# Patient Record
Sex: Female | Born: 1960 | Race: White | Hispanic: No | Marital: Married | Smoking: Never smoker
Health system: Southern US, Community
[De-identification: ages and names within clinical notes are randomized; demographics above are authoritative.]

## PROBLEM LIST (undated history)

## (undated) DIAGNOSIS — G4700 Insomnia, unspecified: Secondary | ICD-10-CM

## (undated) DIAGNOSIS — Z8489 Family history of other specified conditions: Secondary | ICD-10-CM

## (undated) DIAGNOSIS — F419 Anxiety disorder, unspecified: Secondary | ICD-10-CM

## (undated) DIAGNOSIS — I10 Essential (primary) hypertension: Secondary | ICD-10-CM

## (undated) DIAGNOSIS — M1990 Unspecified osteoarthritis, unspecified site: Secondary | ICD-10-CM

## (undated) DIAGNOSIS — R112 Nausea with vomiting, unspecified: Secondary | ICD-10-CM

## (undated) DIAGNOSIS — Z9889 Other specified postprocedural states: Secondary | ICD-10-CM

## (undated) DIAGNOSIS — F32A Depression, unspecified: Secondary | ICD-10-CM

## (undated) DIAGNOSIS — K219 Gastro-esophageal reflux disease without esophagitis: Secondary | ICD-10-CM

## (undated) DIAGNOSIS — F329 Major depressive disorder, single episode, unspecified: Secondary | ICD-10-CM

## (undated) HISTORY — DX: Essential (primary) hypertension: I10

## (undated) HISTORY — DX: Anxiety disorder, unspecified: F41.9

## (undated) HISTORY — PX: BREAST SURGERY: SHX581

## (undated) HISTORY — PX: OTHER SURGICAL HISTORY: SHX169

## (undated) HISTORY — DX: Depression, unspecified: F32.A

## (undated) HISTORY — DX: Unspecified osteoarthritis, unspecified site: M19.90

## (undated) HISTORY — DX: Insomnia, unspecified: G47.00

## (undated) HISTORY — DX: Major depressive disorder, single episode, unspecified: F32.9

---

## 2002-01-30 ENCOUNTER — Ambulatory Visit (HOSPITAL_BASED_OUTPATIENT_CLINIC_OR_DEPARTMENT_OTHER): Admission: RE | Admit: 2002-01-30 | Discharge: 2002-01-30 | Payer: Self-pay | Admitting: Orthopedic Surgery

## 2002-01-30 ENCOUNTER — Encounter: Payer: Self-pay | Admitting: Orthopedic Surgery

## 2005-03-12 ENCOUNTER — Other Ambulatory Visit: Admission: RE | Admit: 2005-03-12 | Discharge: 2005-03-12 | Payer: Self-pay | Admitting: Obstetrics and Gynecology

## 2011-08-03 ENCOUNTER — Ambulatory Visit
Admission: RE | Admit: 2011-08-03 | Discharge: 2011-08-03 | Disposition: A | Discharge status: Home / Self Care | Payer: 59 | Source: Ambulatory Visit | Attending: Physician Assistant | Admitting: Physician Assistant

## 2011-08-03 ENCOUNTER — Other Ambulatory Visit: Payer: Self-pay | Admitting: Physician Assistant

## 2011-08-03 DIAGNOSIS — R0781 Pleurodynia: Secondary | ICD-10-CM

## 2014-04-17 ENCOUNTER — Encounter: Payer: Self-pay | Admitting: *Deleted

## 2014-10-01 ENCOUNTER — Other Ambulatory Visit: Payer: Self-pay | Admitting: Obstetrics and Gynecology

## 2014-10-01 DIAGNOSIS — R928 Other abnormal and inconclusive findings on diagnostic imaging of breast: Secondary | ICD-10-CM

## 2014-10-26 ENCOUNTER — Other Ambulatory Visit: Payer: Self-pay

## 2014-11-22 ENCOUNTER — Ambulatory Visit
Admission: RE | Admit: 2014-11-22 | Discharge: 2014-11-22 | Disposition: A | Discharge status: Home / Self Care | Payer: 59 | Source: Ambulatory Visit | Attending: Obstetrics and Gynecology | Admitting: Obstetrics and Gynecology

## 2014-11-22 DIAGNOSIS — R928 Other abnormal and inconclusive findings on diagnostic imaging of breast: Secondary | ICD-10-CM

## 2016-11-20 ENCOUNTER — Emergency Department (HOSPITAL_COMMUNITY)
Admission: EM | Admit: 2016-11-20 | Discharge: 2016-11-20 | Disposition: A | Discharge status: Home / Self Care | Payer: 59 | Attending: Physician Assistant | Admitting: Physician Assistant

## 2016-11-20 ENCOUNTER — Encounter (HOSPITAL_COMMUNITY): Payer: Self-pay | Admitting: Emergency Medicine

## 2016-11-20 DIAGNOSIS — R112 Nausea with vomiting, unspecified: Secondary | ICD-10-CM | POA: Diagnosis present

## 2016-11-20 DIAGNOSIS — I10 Essential (primary) hypertension: Secondary | ICD-10-CM | POA: Insufficient documentation

## 2016-11-20 DIAGNOSIS — K0889 Other specified disorders of teeth and supporting structures: Secondary | ICD-10-CM | POA: Insufficient documentation

## 2016-11-20 LAB — COMPREHENSIVE METABOLIC PANEL
ALBUMIN: 3.7 g/dL (ref 3.5–5.0)
ALT: 17 U/L (ref 14–54)
AST: 19 U/L (ref 15–41)
Alkaline Phosphatase: 67 U/L (ref 38–126)
Anion gap: 10 (ref 5–15)
BILIRUBIN TOTAL: 0.7 mg/dL (ref 0.3–1.2)
BUN: 14 mg/dL (ref 6–20)
CO2: 23 mmol/L (ref 22–32)
Calcium: 8.3 mg/dL — ABNORMAL LOW (ref 8.9–10.3)
Chloride: 103 mmol/L (ref 101–111)
Creatinine, Ser: 0.64 mg/dL (ref 0.44–1.00)
GFR calc Af Amer: >60 mL/min (ref >60)
GFR calc non Af Amer: >60 mL/min (ref >60)
GLUCOSE: 111 mg/dL — AB (ref 65–99)
POTASSIUM: 4.2 mmol/L (ref 3.5–5.1)
Sodium: 136 mmol/L (ref 135–145)
Total Protein: 6.3 g/dL — ABNORMAL LOW (ref 6.5–8.1)

## 2016-11-20 LAB — CBC
HEMATOCRIT: 37.4 % (ref 36.0–46.0)
Hemoglobin: 12.4 g/dL (ref 12.0–15.0)
MCH: 30.5 pg (ref 26.0–34.0)
MCHC: 33.2 g/dL (ref 30.0–36.0)
MCV: 91.9 fL (ref 78.0–100.0)
PLATELETS: 201 10*3/uL (ref 150–400)
RBC: 4.07 MIL/uL (ref 3.87–5.11)
RDW: 12.9 % (ref 11.5–15.5)
WBC: 13.8 10*3/uL — AB (ref 4.0–10.5)

## 2016-11-20 MED ORDER — ONDANSETRON HCL 4 MG PO TABS: 4.0000 mg | ORAL_TABLET | Freq: Three times a day (TID) | ORAL | 0 refills | Status: DC | PRN

## 2016-11-20 MED ORDER — OXYCODONE-ACETAMINOPHEN 5-325 MG PO TABS
1.0000 | ORAL_TABLET | Freq: Once | ORAL | Status: AC
  Administered 2016-11-20: 1 via ORAL
  Filled 2016-11-20: qty 1

## 2016-11-20 MED ORDER — AMOXICILLIN-POT CLAVULANATE 875-125 MG PO TABS: 1.0000 | ORAL_TABLET | Freq: Two times a day (BID) | ORAL | 0 refills | Status: AC

## 2016-11-20 MED ORDER — IBUPROFEN 800 MG PO TABS: 800.0000 mg | ORAL_TABLET | Freq: Three times a day (TID) | ORAL | 0 refills | Status: AC

## 2016-11-20 MED ORDER — SODIUM CHLORIDE 0.9 % IV BOLUS (SEPSIS)
1000.0000 mL | Freq: Once | INTRAVENOUS | Status: AC
  Administered 2016-11-20: 1000 mL via INTRAVENOUS

## 2016-11-20 MED ORDER — ONDANSETRON HCL 4 MG/2ML IJ SOLN
4.0000 mg | Freq: Once | INTRAMUSCULAR | Status: AC
  Administered 2016-11-20: 4 mg via INTRAVENOUS
  Filled 2016-11-20: qty 2

## 2016-11-20 NOTE — ED Notes (Signed)
EMS gave the patient 4mg  Zofran

## 2016-11-20 NOTE — ED Triage Notes (Signed)
Patient presents today form complaints on Nausea and vomiting x 2 hours. Patient reports get worst  with movement, Patient reports taking her girlfriend  hydrocodone for tooth pain before vomited started. Patient denies any Chest Pain SOB. Patient alert an oriented on arrival

## 2016-11-20 NOTE — ED Provider Notes (Signed)
[facility]-EMERGENCY DEPT Provider Note   CSN: [id number] Arrival date & time: 11/20/16  1329     History   Chief Complaint Chief Complaint  Patient presents with  . Emesis    HPI [name] is a 56 y.o. female who presents to the emergency department with non-bilious, non-bloody emesis and nausea x2 hours. She reports that she was seen by her dentist last week and is scheduled to have a root canal for a left maxillary tooth in 2 days. She reports the pain significantly worsened yesterday, and she was no longer able to control the pain with ibuprofen. She reports that her significant other gave her pain medication that contained hydrocodone and acetaminophen and she took 2 tablets last night and 2 tablets this morning prior to the onset of her symptoms. She denies fever, chills, facial swelling, abdominal pain, melena, hematochezia, dyspnea, CP, trismus, drooling, or difficulty breathing.   She reports that she had a previously root canal about 9 months ago and has received regularly dental check-ups every 6 months.   PMH includes HTN and daily medications include Premarin and lisinopril.  The history is provided by a significant other. No language interpreter was used.    Past Medical History:  Diagnosis Date  . Anxiety   . Depression   . Hypertension   . Insomnia   . Osteoarthritis     There are no active problems to display for this patient.   Past Surgical History:  Procedure Laterality Date  . arthroscopic hip surgery    . right hip replacement    . transgender surgery      OB History    No data available       Home Medications    Prior to Admission medications   Medication Sig Start Date End Date Taking? Authorizing Provider  amoxicillin-clavulanate (AUGMENTIN) 875-125 MG tablet Take 1 tablet by mouth every 12 (twelve) hours. 11/20/16 11/25/16  Chalmer Zheng A, PA-C  estrogens, conjugated, (PREMARIN) 0.3 MG tablet Take 0.3 mg by mouth daily. Take daily for 21  days then do not take for 7 days.    [provider]  ibuprofen (ADVIL,MOTRIN) 800 MG tablet Take 1 tablet (800 mg total) by mouth every 8 (eight) hours. 11/20/16 11/27/16  Ashlee Bewley A, PA-C  lisinopril (PRINIVIL,ZESTRIL) 10 MG tablet Take 10 mg by mouth daily.    [provider]  ondansetron (ZOFRAN) 4 MG tablet Take 1 tablet (4 mg total) by mouth every 8 (eight) hours as needed for nausea or vomiting. 11/20/16   Jenkins Risdon A, PA-C    Family History Family History  Problem Relation Age of Onset  . Hypertension Father     Social History Social History  Substance Use Topics  . Smoking status: Never Smoker  . Smokeless tobacco: Not on file  . Alcohol use Yes     Allergies   Patient has no known allergies.   Review of Systems Review of Systems  Constitutional: Negative for activity change, chills and fever.  HENT: Positive for dental problem. Negative for facial swelling, sore throat and trouble swallowing.   Respiratory: Negative for shortness of breath.   Cardiovascular: Negative for chest pain.  Gastrointestinal: Positive for nausea and vomiting. Negative for abdominal pain and diarrhea.  Musculoskeletal: Negative for back pain, gait problem and neck pain.  Skin: Negative for rash.   Physical Exam Updated Vital Signs BP (!) 148/92   Pulse 64   Temp (!) 97.4 F (36.3 C) (Oral)  Resp 18   Ht 5\' 7"  (1.702 m)   Wt 86.2 kg (190 lb)   SpO2 99%   BMI 29.76 kg/m   Physical Exam  Constitutional: She appears well-developed and well-nourished. No distress.  HENT:  Head: Normocephalic and atraumatic.  Right Ear: Tympanic membrane and external ear normal.  Left Ear: Tympanic membrane and external ear normal.  Nose: Nose normal. Right sinus exhibits no maxillary sinus tenderness and no frontal sinus tenderness. Left sinus exhibits no maxillary sinus tenderness and no frontal sinus tenderness.  Mouth/Throat: Uvula is midline, oropharynx is clear and  moist and mucous membranes are normal. She does not have dentures. No oral lesions. No trismus in the jaw. Abnormal dentition. Dental caries present. No dental abscesses or uvula swelling. No tonsillar exudate.  Multiple dental fillings present. TTP over the left upper maxillary teeth. No gross abscess. No TTP over the surrounding gingiva. No purulent drainage expressed. No facial swelling or trismus.   Eyes: Conjunctivae are normal.  Neck: Normal range of motion. Neck supple.  Cardiovascular: Normal rate, regular rhythm and normal heart sounds.  Exam reveals no gallop and no friction rub.   No murmur heard. Pulmonary/Chest: Effort normal and breath sounds normal. No respiratory distress. She has no wheezes. She has no rales.  Abdominal: Soft. Bowel sounds are normal. She exhibits no distension and no mass. There is no tenderness. There is no rebound and no guarding. No hernia.  Neurological: She is alert.  Skin: Skin is warm. No rash noted. She is not diaphoretic.  Psychiatric: Her behavior is normal.  Nursing note and vitals reviewed.    ED Treatments / Results  Labs (all labs ordered are listed, but only abnormal results are displayed) Labs Reviewed  CBC - Abnormal; Notable for the following:       Result Value   WBC 13.8 (*)    All other components within normal limits  COMPREHENSIVE METABOLIC PANEL - Abnormal; Notable for the following:    Glucose, Bld 111 (*)    Calcium 8.3 (*)    Total Protein 6.3 (*)    All other components within normal limits    EKG  EKG Interpretation None       Radiology No results found.  Procedures Procedures (including critical care time)  Medications Ordered in ED Medications  ondansetron (ZOFRAN) injection 4 mg (4 mg Intravenous Given 11/20/16 1600)  sodium chloride 0.9 % bolus 1,000 mL (0 mLs Intravenous Stopped 11/20/16 1746)  oxyCODONE-acetaminophen (PERCOCET/ROXICET) 5-325 MG per tablet 1 tablet (1 tablet Oral Given 11/20/16 1548)      Initial Impression / Assessment and Plan / ED Course  I have reviewed the triage vital signs and the nursing notes.  Pertinent labs & imaging results that were available during my care of the patient were reviewed by me and considered in my medical decision making (see chart for details).     56 year old patient presenting with complaints of N/V x2 hours and left upper dental pain. WBC 13.8. Electrolytes unremarkable. Abdominal exam unremarkable. No gross abscess, trismus, drooling, or respiratory distress noted on exam. Fluid bolus given. Nausea improved with Zofran. N/V likely secondary to worsening dental pain or new hydrocodone with no h/o of narcotic pain medication use. Percocet given in the ED with improvement of the dental pain. Patient declines prescription of pain medication and dental block for pain control and will use ibuprofen and Tylenol until follow-up oral surgery appointment in 2 days. Patient successfully fluid challenged in the ED.  Will d/c the patient with Augmentin for dental infection prophylaxis and Zofran for nausea. Strict return precautions given. Answered all patient's questions at this time. NAD. VSS. The patient is safe for d/c at this time.   Final Clinical Impressions(s) / ED Diagnoses   Final diagnoses:  Pain, dental  Non-intractable vomiting with nausea, unspecified vomiting type    New Prescriptions Discharge Medication List as of 11/20/2016  5:35 PM    START taking these medications   Details  amoxicillin-clavulanate (AUGMENTIN) 875-125 MG tablet Take 1 tablet by mouth every 12 (twelve) hours., Starting Tue 11/20/2016, Until Sun 11/25/2016, Print    ibuprofen (ADVIL,MOTRIN) 800 MG tablet Take 1 tablet (800 mg total) by mouth every 8 (eight) hours., Starting Tue 11/20/2016, Until Tue 11/27/2016, Print    ondansetron (ZOFRAN) 4 MG tablet Take 1 tablet (4 mg total) by mouth every 8 (eight) hours as needed for nausea or vomiting., Starting Tue 11/20/2016,  Print         Ceria Suminski A, PA-C 11/21/16 0010    Abelino DerrickMackuen, Courteney Lyn, MD 11/21/16 1047

## 2016-11-20 NOTE — Discharge Instructions (Signed)
You may take Zofran (ondansetron) once every 4-8 hours as needed for nausea and vomiting. You may take 800 mg of ibuprofen every 8 hours with food and alternate with 650 mg of Tylenol, which may be taken every 6 hours.   Please take Augmentin once every 12 hours. Augmentin as an antibiotic and can be taken for prevention of dental infections.   If you develop new or worsening symptoms, including nausea and vomiting, despite taking Zofran, the inability to fully open your mouth, difficulty swallowing or breathing, please return to the Emergency Department for re-evaluation.   Please keep your appointment with the oral surgeon on Thursday.

## 2016-11-20 NOTE — ED Notes (Signed)
Pt ambulatory to restroom

## 2019-12-08 ENCOUNTER — Telehealth: Payer: Self-pay

## 2019-12-30 ENCOUNTER — Encounter: Payer: Self-pay | Admitting: Orthopaedic Surgery

## 2019-12-30 ENCOUNTER — Ambulatory Visit (INDEPENDENT_AMBULATORY_CARE_PROVIDER_SITE_OTHER): Payer: 59 | Admitting: Orthopaedic Surgery

## 2019-12-30 ENCOUNTER — Ambulatory Visit: Payer: Self-pay

## 2019-12-30 VITALS — Ht 67.0 in | Wt 191.2 lb

## 2019-12-30 DIAGNOSIS — M1612 Unilateral primary osteoarthritis, left hip: Secondary | ICD-10-CM

## 2019-12-30 DIAGNOSIS — M25552 Pain in left hip: Secondary | ICD-10-CM

## 2019-12-30 NOTE — Progress Notes (Signed)
Office Visit Note   Patient: [name]           Date of Birth: 1960-12-03           MRN: [id number] Visit Date: 12/30/2019              Requested by: No referring provider defined for this encounter. PCP: Pcp, No   Assessment & Plan: Visit Diagnoses:  1. Pain in left hip   2. Unilateral primary osteoarthritis, left hip     Plan: The patient does have quite significant arthritis in her left hip and is a good candidate for hip replacement surgery.  I explained in detail what the surgery involves and having had it done before she is fully aware of this.  I gave her handout about anterior hip surgery.  We discussed the interoperative and postoperative course as well as the risk and benefits of surgery.  She is a good candidate for outpatient same-day type of surgery and explained this to her as well and she agrees.  All questions and concerns were answered addressed.  We will work on getting this scheduled in the near future.  Follow-Up Instructions: Return for 2 weeks post-op.   Orders:  Orders Placed This Encounter  Procedures  . XR HIP UNILAT W OR W/O PELVIS 2-3 VIEWS LEFT   No orders of the defined types were placed in this encounter.     Procedures: No procedures performed   Clinical Data: No additional findings.   Subjective: Chief Complaint  Patient presents with  . Left Hip - Pain  The patient is a very pleasant 59 year old female who comes in for evaluation treatment of left hip pain with known osteoarthritis left hip.  The pain is in the groin and is anterior and lateral.  She has a history of a right total hip arthroplasty done 6 years ago in [facility]  Her pain in the left hip is become a stabbing type of pain.  Hurts with walking and pivoting activities.  Going up and down stairs is painful.  At this point her left hip pain is detriment affecting her actives daily living, her mobility and her quality of life.  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  She has worked on weight loss and is incredibly active.  She is interested in a left total hip arthroplasty in the near future.  She is not a diabetic and not a smoker.  She has stable blood pressure.  HPI  Review of Systems She currently denies any headache, chest pain, shortness of breath, fever, chills, nausea, vomiting  Objective: Vital Signs: Ht 5\' 7"  (1.702 m)   Wt 191 lb 3.2 oz (86.7 kg)   BMI 29.95 kg/m   Physical Exam She is alert and orient x3 and in no acute distress Ortho Exam Examination of her left hip show significant stiffness with internal and external rotation with pain in the groin on rotation as well.  Her right total hip arthroplasty moves smoothly and stable without pain. Specialty Comments:  No specialty comments available.  Imaging: XR HIP UNILAT W OR W/O PELVIS 2-3 VIEWS LEFT  Result Date: 12/30/2019 An AP pelvis and lateral left hip shows severe osteoarthritis left hip with joint space narrowing, flattening of the superior lateral femoral head and periarticular osteophytes.  There is a right total hip arthroplasty.    PMFS History: Patient Active Problem List   Diagnosis Date Noted  . Unilateral primary  osteoarthritis, left hip 12/30/2019   Past Medical History:  Diagnosis Date  . Anxiety   . Depression   . Hypertension   . Insomnia   . Osteoarthritis     Family History  Problem Relation Age of Onset  . Hypertension Father     Past Surgical History:  Procedure Laterality Date  . arthroscopic hip surgery    . right hip replacement    . transgender surgery     Social History   Occupational History  . Not on file  Tobacco Use  . Smoking status: Never Smoker  Substance and Sexual Activity  . Alcohol use: Yes  . Drug use: No  . Sexual activity: Not on file

## 2020-01-04 ENCOUNTER — Ambulatory Visit: Payer: 59 | Admitting: Physician Assistant

## 2020-01-18 ENCOUNTER — Other Ambulatory Visit: Payer: Self-pay

## 2020-02-16 ENCOUNTER — Other Ambulatory Visit: Payer: Self-pay | Admitting: Physician Assistant

## 2020-02-16 NOTE — Progress Notes (Signed)
Need orders in epic.  Surgery on 11/19.  Proep on 11/10.

## 2020-02-17 ENCOUNTER — Encounter (HOSPITAL_COMMUNITY): Payer: Self-pay

## 2020-02-17 ENCOUNTER — Other Ambulatory Visit: Payer: Self-pay

## 2020-02-17 ENCOUNTER — Encounter (HOSPITAL_COMMUNITY)
Admission: RE | Admit: 2020-02-17 | Discharge: 2020-02-17 | Disposition: A | Discharge status: Home / Self Care | Payer: 59 | Source: Ambulatory Visit | Attending: Orthopaedic Surgery | Admitting: Orthopaedic Surgery

## 2020-02-17 DIAGNOSIS — Z01818 Encounter for other preprocedural examination: Secondary | ICD-10-CM | POA: Insufficient documentation

## 2020-02-17 HISTORY — DX: Gastro-esophageal reflux disease without esophagitis: K21.9

## 2020-02-17 HISTORY — DX: Other specified postprocedural states: Z98.890

## 2020-02-17 HISTORY — DX: Nausea with vomiting, unspecified: R11.2

## 2020-02-17 HISTORY — DX: Family history of other specified conditions: Z84.89

## 2020-02-17 LAB — CBC
HCT: 40.3 % (ref 36.0–46.0)
Hemoglobin: 13.1 g/dL (ref 12.0–15.0)
MCH: 31.4 pg (ref 26.0–34.0)
MCHC: 32.5 g/dL (ref 30.0–36.0)
MCV: 96.6 fL (ref 80.0–100.0)
Platelets: 195 10*3/uL (ref 150–400)
RBC: 4.17 MIL/uL (ref 3.87–5.11)
RDW: 13.1 % (ref 11.5–15.5)
WBC: 5.2 10*3/uL (ref 4.0–10.5)
nRBC: 0 % (ref 0.0–0.2)

## 2020-02-17 LAB — BASIC METABOLIC PANEL
Anion gap: 9 (ref 5–15)
BUN: 19 mg/dL (ref 6–20)
CO2: 25 mmol/L (ref 22–32)
Calcium: 8.8 mg/dL — ABNORMAL LOW (ref 8.9–10.3)
Chloride: 105 mmol/L (ref 98–111)
Creatinine, Ser: 0.64 mg/dL (ref 0.44–1.00)
GFR, Estimated: >60 mL/min (ref >60)
Glucose, Bld: 104 mg/dL — ABNORMAL HIGH (ref 70–99)
Potassium: 4.5 mmol/L (ref 3.5–5.1)
Sodium: 139 mmol/L (ref 135–145)

## 2020-02-17 LAB — SURGICAL PCR SCREEN
MRSA, PCR: NEGATIVE
Staphylococcus aureus: POSITIVE — AB

## 2020-02-17 NOTE — Progress Notes (Signed)
DUE TO COVID-19 ONLY ONE VISITOR IS ALLOWED TO COME WITH YOU AND STAY IN THE WAITING ROOM ONLY DURING PRE OP AND PROCEDURE DAY OF SURGERY. THE 1 VISITOR  MAY VISIT WITH YOU AFTER SURGERY IN YOUR PRIVATE ROOM DURING VISITING HOURS ONLY!  YOU NEED TO HAVE A COVID 19 TEST ON___11/16/2021 ____ @_______ , THIS TEST MUST BE DONE BEFORE SURGERY,  COVID TESTING SITE [address] [place] , IT IS ON THE RIGHT GOING OUT [address] APPROXIMATELY  2 MINUTES PAST [employer] ON THE RIGHT. ONCE YOUR COVID TEST IS COMPLETED,  PLEASE BEGIN THE QUARANTINE INSTRUCTIONS AS OUTLINED IN YOUR HANDOUT.                [name]  02/17/2020   Your procedure is scheduled on:  02/26/2020   Report to [facility] Main  Entrance   Report to admitting at  0945 AM     Call this number if you have problems the morning of surgery [phone]    REMEMBER: NO  SOLID FOOD CANDY OR GUM AFTER MIDNIGHT. CLEAR LIQUIDS UNTIL    0915AM     . NOTHING BY MOUTH EXCEPT CLEAR LIQUIDS UNTIL    . PLEASE FINISH ENSURE DRINK PER SURGEON ORDER  WHICH NEEDS TO BE COMPLETED AT      03-19-1998.      CLEAR LIQUID DIET   Foods Allowed                                                                    Coffee and tea, regular and decaf                            Fruit ices (not with fruit pulp)                                      Iced Popsicles                                    Carbonated beverages, regular and diet                                    Cranberry, grape and apple juices Sports drinks like Gatorade Lightly seasoned clear broth or consume(fat free) Sugar, honey syrup ___________________________________________________________________      BRUSH YOUR TEETH MORNING OF SURGERY AND RINSE YOUR MOUTH OUT, NO CHEWING GUM CANDY OR MINTS.     Take these medicines the morning of surgery with A SIP OF WATER: NONE   DO NOT TAKE ANY DIABETIC MEDICATIONS DAY OF YOUR SURGERY                                You may not have any metal on your body including hair pins and              piercings  Do not wear jewelry, make-up, lotions, powders  or perfumes, deodorant             Do not wear nail polish on your fingernails.  Do not shave  48 hours prior to surgery.              Men may shave face and neck.   Do not bring valuables to the hospital. [place].  Contacts, dentures or bridgework may not be worn into surgery.  Leave suitcase in the car. After surgery it may be brought to your room.     Patients discharged the day of surgery will not be allowed to drive home. IF YOU ARE HAVING SURGERY AND GOING HOME THE SAME DAY, YOU MUST HAVE AN ADULT TO DRIVE YOU HOME AND BE WITH YOU FOR 24 HOURS. YOU MAY GO HOME BY TAXI OR UBER OR ORTHERWISE, BUT AN ADULT MUST ACCOMPANY YOU HOME AND STAY WITH YOU FOR 24 HOURS.  Name and phone number of your driver:  Special Instructions: N/A              Please read over the following fact sheets you were given: _____________________________________________________________________  [facility] - Preparing for Surgery Before surgery, you can play an important role.  Because skin is not sterile, your skin needs to be as free of germs as possible.  You can reduce the number of germs on your skin by washing with CHG (chlorahexidine gluconate) soap before surgery.  CHG is an antiseptic cleaner which kills germs and bonds with the skin to continue killing germs even after washing. Please DO NOT use if you have an allergy to CHG or antibacterial soaps.  If your skin becomes reddened/irritated stop using the CHG and inform your nurse when you arrive at Short Stay. Do not shave (including legs and underarms) for at least 48 hours prior to the first CHG shower.  You may shave your face/neck. Please follow these instructions carefully:  1.  Shower with CHG Soap the night before surgery and the  morning of  Surgery.  2.  If you choose to wash your hair, wash your hair first as usual with your  normal  shampoo.  3.  After you shampoo, rinse your hair and body thoroughly to remove the  shampoo.                           4.  Use CHG as you would any other liquid soap.  You can apply chg directly  to the skin and wash                       Gently with a scrungie or clean washcloth.  5.  Apply the CHG Soap to your body ONLY FROM THE NECK DOWN.   Do not use on face/ open                           Wound or open sores. Avoid contact with eyes, ears mouth and genitals (private parts).                       Wash face,  Genitals (private parts) with your normal soap.             6.  Wash thoroughly, paying special attention to the area  where your surgery  will be performed.  7.  Thoroughly rinse your body with warm water from the neck down.  8.  DO NOT shower/wash with your normal soap after using and rinsing off  the CHG Soap.                9.  Pat yourself dry with a clean towel.            10.  Wear clean pajamas.            11.  Place clean sheets on your bed the night of your first shower and do not  sleep with pets. Day of Surgery : Do not apply any lotions/deodorants the morning of surgery.  Please wear clean clothes to the hospital/surgery center.  FAILURE TO FOLLOW THESE INSTRUCTIONS MAY RESULT IN THE CANCELLATION OF YOUR SURGERY PATIENT SIGNATURE_________________________________  NURSE SIGNATURE__________________________________  ________________________________________________________________________

## 2020-02-18 NOTE — Progress Notes (Signed)
Final EKG 02/17/20-epic

## 2020-02-23 ENCOUNTER — Other Ambulatory Visit (HOSPITAL_COMMUNITY)
Admission: RE | Admit: 2020-02-23 | Discharge: 2020-02-23 | Disposition: A | Discharge status: Home / Self Care | Payer: 59 | Source: Ambulatory Visit | Attending: Orthopaedic Surgery | Admitting: Orthopaedic Surgery

## 2020-02-23 DIAGNOSIS — Z01812 Encounter for preprocedural laboratory examination: Secondary | ICD-10-CM | POA: Diagnosis present

## 2020-02-23 DIAGNOSIS — Z20822 Contact with and (suspected) exposure to covid-19: Secondary | ICD-10-CM | POA: Insufficient documentation

## 2020-02-23 LAB — SARS CORONAVIRUS 2 (TAT 6-24 HRS): SARS Coronavirus 2: NEGATIVE

## 2020-02-26 ENCOUNTER — Ambulatory Visit (HOSPITAL_COMMUNITY): Payer: 59 | Admitting: Physician Assistant

## 2020-02-26 ENCOUNTER — Ambulatory Visit (HOSPITAL_COMMUNITY): Payer: 59

## 2020-02-26 ENCOUNTER — Other Ambulatory Visit: Payer: Self-pay

## 2020-02-26 ENCOUNTER — Ambulatory Visit (HOSPITAL_COMMUNITY): Payer: 59 | Admitting: Certified Registered Nurse Anesthetist

## 2020-02-26 ENCOUNTER — Observation Stay (HOSPITAL_COMMUNITY)
Admission: RE | Admit: 2020-02-26 | Discharge: 2020-02-27 | Disposition: A | Discharge status: Home / Self Care | Payer: 59 | Attending: Orthopaedic Surgery | Admitting: Orthopaedic Surgery

## 2020-02-26 ENCOUNTER — Observation Stay (HOSPITAL_COMMUNITY): Payer: 59

## 2020-02-26 ENCOUNTER — Encounter (HOSPITAL_COMMUNITY): Payer: Self-pay | Admitting: Orthopaedic Surgery

## 2020-02-26 ENCOUNTER — Encounter (HOSPITAL_COMMUNITY)
Admission: RE | Disposition: A | Discharge status: Home / Self Care | Payer: Self-pay | Source: Home / Self Care | Attending: Orthopaedic Surgery

## 2020-02-26 DIAGNOSIS — I10 Essential (primary) hypertension: Secondary | ICD-10-CM | POA: Diagnosis not present

## 2020-02-26 DIAGNOSIS — Z96642 Presence of left artificial hip joint: Secondary | ICD-10-CM | POA: Diagnosis not present

## 2020-02-26 DIAGNOSIS — Z79899 Other long term (current) drug therapy: Secondary | ICD-10-CM | POA: Insufficient documentation

## 2020-02-26 DIAGNOSIS — M1612 Unilateral primary osteoarthritis, left hip: Principal | ICD-10-CM | POA: Insufficient documentation

## 2020-02-26 DIAGNOSIS — M25552 Pain in left hip: Secondary | ICD-10-CM | POA: Diagnosis present

## 2020-02-26 DIAGNOSIS — Z419 Encounter for procedure for purposes other than remedying health state, unspecified: Secondary | ICD-10-CM

## 2020-02-26 DIAGNOSIS — Z791 Long term (current) use of non-steroidal anti-inflammatories (NSAID): Secondary | ICD-10-CM | POA: Insufficient documentation

## 2020-02-26 HISTORY — PX: TOTAL HIP ARTHROPLASTY: SHX124

## 2020-02-26 LAB — ABO/RH: ABO/RH(D): A POS

## 2020-02-26 LAB — TYPE AND SCREEN
ABO/RH(D): A POS
Antibody Screen: NEGATIVE

## 2020-02-26 SURGERY — ARTHROPLASTY, HIP, TOTAL, ANTERIOR APPROACH
Anesthesia: Spinal | Site: Hip | Laterality: Left

## 2020-02-26 MED ORDER — ONDANSETRON HCL 4 MG/2ML IJ SOLN
INTRAMUSCULAR | Status: AC
  Filled 2020-02-26: qty 2

## 2020-02-26 MED ORDER — DEXAMETHASONE SODIUM PHOSPHATE 10 MG/ML IJ SOLN
INTRAMUSCULAR | Status: DC | PRN
  Administered 2020-02-26: 8 mg via INTRAVENOUS

## 2020-02-26 MED ORDER — FENTANYL CITRATE (PF) 100 MCG/2ML IJ SOLN
INTRAMUSCULAR | Status: AC
  Filled 2020-02-26: qty 2

## 2020-02-26 MED ORDER — 0.9 % SODIUM CHLORIDE (POUR BTL) OPTIME
TOPICAL | Status: DC | PRN
  Administered 2020-02-26: 1000 mL

## 2020-02-26 MED ORDER — ESTRADIOL 1 MG PO TABS
0.5000 mg | ORAL_TABLET | Freq: Every day | ORAL | Status: DC
  Administered 2020-02-27: 0.5 mg via ORAL
  Filled 2020-02-26: qty 0.5

## 2020-02-26 MED ORDER — DIPHENHYDRAMINE HCL 12.5 MG/5ML PO ELIX: 12.5000 mg | ORAL_SOLUTION | ORAL | Status: DC | PRN

## 2020-02-26 MED ORDER — ACETAMINOPHEN 325 MG PO TABS: 325.0000 mg | ORAL_TABLET | Freq: Four times a day (QID) | ORAL | Status: DC | PRN

## 2020-02-26 MED ORDER — LISINOPRIL 10 MG PO TABS
10.0000 mg | ORAL_TABLET | Freq: Every day | ORAL | Status: DC
  Administered 2020-02-26 – 2020-02-27 (×2): 10 mg via ORAL
  Filled 2020-02-26 (×2): qty 1

## 2020-02-26 MED ORDER — OXYCODONE HCL 5 MG PO TABS
10.0000 mg | ORAL_TABLET | ORAL | Status: DC | PRN
  Administered 2020-02-26: 10 mg via ORAL
  Administered 2020-02-27: 15 mg via ORAL
  Administered 2020-02-27: 10 mg via ORAL
  Filled 2020-02-26: qty 2
  Filled 2020-02-26 (×2): qty 3
  Filled 2020-02-26: qty 2

## 2020-02-26 MED ORDER — TRANEXAMIC ACID-NACL 1000-0.7 MG/100ML-% IV SOLN
1000.0000 mg | INTRAVENOUS | Status: AC
  Administered 2020-02-26: 1000 mg via INTRAVENOUS
  Filled 2020-02-26: qty 100

## 2020-02-26 MED ORDER — POLYETHYLENE GLYCOL 3350 17 G PO PACK: 17.0000 g | PACK | Freq: Every day | ORAL | Status: DC | PRN

## 2020-02-26 MED ORDER — FESOTERODINE FUMARATE ER 8 MG PO TB24
8.0000 mg | ORAL_TABLET | Freq: Every day | ORAL | Status: DC
  Administered 2020-02-26 – 2020-02-27 (×2): 8 mg via ORAL
  Filled 2020-02-26 (×2): qty 1

## 2020-02-26 MED ORDER — BUPIVACAINE IN DEXTROSE 0.75-8.25 % IT SOLN
INTRATHECAL | Status: DC | PRN
  Administered 2020-02-26: 1.8 mL via INTRATHECAL

## 2020-02-26 MED ORDER — FENTANYL CITRATE (PF) 100 MCG/2ML IJ SOLN: 25.0000 ug | INTRAMUSCULAR | Status: DC | PRN

## 2020-02-26 MED ORDER — PROPOFOL 500 MG/50ML IV EMUL
INTRAVENOUS | Status: AC
  Filled 2020-02-26: qty 50

## 2020-02-26 MED ORDER — CHLORHEXIDINE GLUCONATE 0.12 % MT SOLN
15.0000 mL | Freq: Once | OROMUCOSAL | Status: AC
  Administered 2020-02-26: 15 mL via OROMUCOSAL

## 2020-02-26 MED ORDER — OXYCODONE HCL 5 MG/5ML PO SOLN: 5.0000 mg | Freq: Once | ORAL | Status: DC | PRN

## 2020-02-26 MED ORDER — LACTATED RINGERS IV SOLN: INTRAVENOUS | Status: DC

## 2020-02-26 MED ORDER — ASPIRIN 81 MG PO CHEW
81.0000 mg | CHEWABLE_TABLET | Freq: Two times a day (BID) | ORAL | Status: DC
  Administered 2020-02-26 – 2020-02-27 (×2): 81 mg via ORAL
  Filled 2020-02-26 (×2): qty 1

## 2020-02-26 MED ORDER — SODIUM CHLORIDE 0.9 % IV SOLN: INTRAVENOUS | Status: DC

## 2020-02-26 MED ORDER — PANTOPRAZOLE SODIUM 40 MG PO TBEC
40.0000 mg | DELAYED_RELEASE_TABLET | Freq: Every day | ORAL | Status: DC
  Administered 2020-02-27: 40 mg via ORAL
  Filled 2020-02-26: qty 1

## 2020-02-26 MED ORDER — ZOLPIDEM TARTRATE 5 MG PO TABS: 5.0000 mg | ORAL_TABLET | Freq: Every evening | ORAL | Status: DC | PRN

## 2020-02-26 MED ORDER — PROMETHAZINE HCL 25 MG/ML IJ SOLN: 6.2500 mg | INTRAMUSCULAR | Status: DC | PRN

## 2020-02-26 MED ORDER — ONDANSETRON HCL 4 MG/2ML IJ SOLN: 4.0000 mg | Freq: Four times a day (QID) | INTRAMUSCULAR | Status: DC | PRN

## 2020-02-26 MED ORDER — DEXAMETHASONE SODIUM PHOSPHATE 10 MG/ML IJ SOLN
INTRAMUSCULAR | Status: AC
  Filled 2020-02-26: qty 1

## 2020-02-26 MED ORDER — ALUM & MAG HYDROXIDE-SIMETH 200-200-20 MG/5ML PO SUSP: 30.0000 mL | ORAL | Status: DC | PRN

## 2020-02-26 MED ORDER — ACETAMINOPHEN 10 MG/ML IV SOLN: 1000.0000 mg | Freq: Once | INTRAVENOUS | Status: DC | PRN

## 2020-02-26 MED ORDER — METHOCARBAMOL 500 MG PO TABS
500.0000 mg | ORAL_TABLET | Freq: Four times a day (QID) | ORAL | Status: DC | PRN
  Administered 2020-02-26 – 2020-02-27 (×3): 500 mg via ORAL
  Filled 2020-02-26 (×3): qty 1

## 2020-02-26 MED ORDER — PROPOFOL 500 MG/50ML IV EMUL
INTRAVENOUS | Status: DC | PRN
  Administered 2020-02-26: 70 ug/kg/min via INTRAVENOUS

## 2020-02-26 MED ORDER — CEFAZOLIN SODIUM-DEXTROSE 2-4 GM/100ML-% IV SOLN
2.0000 g | INTRAVENOUS | Status: AC
  Administered 2020-02-26: 2 g via INTRAVENOUS
  Filled 2020-02-26: qty 100

## 2020-02-26 MED ORDER — SODIUM CHLORIDE 0.9 % IR SOLN
Status: DC | PRN
  Administered 2020-02-26: 1000 mL

## 2020-02-26 MED ORDER — PHENYLEPHRINE HCL-NACL 10-0.9 MG/250ML-% IV SOLN
INTRAVENOUS | Status: DC | PRN
  Administered 2020-02-26: 40 ug/min via INTRAVENOUS

## 2020-02-26 MED ORDER — PROPOFOL 10 MG/ML IV BOLUS
INTRAVENOUS | Status: AC
  Filled 2020-02-26: qty 20

## 2020-02-26 MED ORDER — GABAPENTIN 100 MG PO CAPS
100.0000 mg | ORAL_CAPSULE | Freq: Three times a day (TID) | ORAL | Status: DC
  Administered 2020-02-26 – 2020-02-27 (×3): 100 mg via ORAL
  Filled 2020-02-26 (×3): qty 1

## 2020-02-26 MED ORDER — FENTANYL CITRATE (PF) 100 MCG/2ML IJ SOLN
INTRAMUSCULAR | Status: DC | PRN
  Administered 2020-02-26: 50 ug via INTRAVENOUS

## 2020-02-26 MED ORDER — ONDANSETRON HCL 4 MG PO TABS: 4.0000 mg | ORAL_TABLET | Freq: Four times a day (QID) | ORAL | Status: DC | PRN

## 2020-02-26 MED ORDER — MIDAZOLAM HCL 2 MG/2ML IJ SOLN
INTRAMUSCULAR | Status: AC
  Filled 2020-02-26: qty 2

## 2020-02-26 MED ORDER — ONDANSETRON HCL 4 MG/2ML IJ SOLN
INTRAMUSCULAR | Status: DC | PRN
Start: 2020-02-26 — End: 2020-02-26
  Administered 2020-02-26: 4 mg via INTRAVENOUS

## 2020-02-26 MED ORDER — HYDROMORPHONE HCL 1 MG/ML IJ SOLN
0.5000 mg | INTRAMUSCULAR | Status: DC | PRN
  Administered 2020-02-26: 0.5 mg via INTRAVENOUS
  Filled 2020-02-26: qty 1

## 2020-02-26 MED ORDER — PHENOL 1.4 % MT LIQD: 1.0000 | OROMUCOSAL | Status: DC | PRN

## 2020-02-26 MED ORDER — ORAL CARE MOUTH RINSE: 15.0000 mL | Freq: Once | OROMUCOSAL | Status: AC

## 2020-02-26 MED ORDER — PROPOFOL 10 MG/ML IV BOLUS
INTRAVENOUS | Status: DC | PRN
  Administered 2020-02-26 (×3): 30 mg via INTRAVENOUS
  Administered 2020-02-26: 50 mg via INTRAVENOUS
  Administered 2020-02-26: 30 mg via INTRAVENOUS

## 2020-02-26 MED ORDER — OXYCODONE HCL 5 MG PO TABS: 5.0000 mg | ORAL_TABLET | Freq: Once | ORAL | Status: DC | PRN

## 2020-02-26 MED ORDER — METOCLOPRAMIDE HCL 5 MG PO TABS: 5.0000 mg | ORAL_TABLET | Freq: Three times a day (TID) | ORAL | Status: DC | PRN

## 2020-02-26 MED ORDER — MIDAZOLAM HCL 5 MG/5ML IJ SOLN
INTRAMUSCULAR | Status: DC | PRN
  Administered 2020-02-26: 2 mg via INTRAVENOUS

## 2020-02-26 MED ORDER — STERILE WATER FOR IRRIGATION IR SOLN
Status: DC | PRN
  Administered 2020-02-26: 2000 mL

## 2020-02-26 MED ORDER — MENTHOL 3 MG MT LOZG: 1.0000 | LOZENGE | OROMUCOSAL | Status: DC | PRN

## 2020-02-26 MED ORDER — DOCUSATE SODIUM 100 MG PO CAPS
100.0000 mg | ORAL_CAPSULE | Freq: Two times a day (BID) | ORAL | Status: DC
  Administered 2020-02-26 – 2020-02-27 (×2): 100 mg via ORAL
  Filled 2020-02-26 (×2): qty 1

## 2020-02-26 MED ORDER — OXYCODONE HCL 5 MG PO TABS
5.0000 mg | ORAL_TABLET | ORAL | Status: DC | PRN
  Administered 2020-02-26 (×2): 10 mg via ORAL
  Filled 2020-02-26: qty 2

## 2020-02-26 MED ORDER — METOCLOPRAMIDE HCL 5 MG/ML IJ SOLN: 5.0000 mg | Freq: Three times a day (TID) | INTRAMUSCULAR | Status: DC | PRN

## 2020-02-26 MED ORDER — CEFAZOLIN SODIUM-DEXTROSE 1-4 GM/50ML-% IV SOLN
1.0000 g | Freq: Four times a day (QID) | INTRAVENOUS | Status: AC
  Administered 2020-02-26 (×2): 1 g via INTRAVENOUS
  Filled 2020-02-26 (×2): qty 50

## 2020-02-26 MED ORDER — METHOCARBAMOL 500 MG IVPB - SIMPLE MED
500.0000 mg | Freq: Four times a day (QID) | INTRAVENOUS | Status: DC | PRN
  Filled 2020-02-26: qty 50

## 2020-02-26 MED ORDER — POVIDONE-IODINE 10 % EX SWAB
2.0000 "application " | Freq: Once | CUTANEOUS | Status: AC
  Administered 2020-02-26: 2 via TOPICAL

## 2020-02-26 SURGICAL SUPPLY — 42 items
APL SKNCLS STERI-STRIP NONHPOA (GAUZE/BANDAGES/DRESSINGS)
BAG SPEC THK2 15X12 ZIP CLS (MISCELLANEOUS)
BAG ZIPLOCK 12X15 (MISCELLANEOUS) IMPLANT
BENZOIN TINCTURE PRP APPL 2/3 (GAUZE/BANDAGES/DRESSINGS) IMPLANT
BLADE SAW SGTL 18X1.27X75 (BLADE) ×2 IMPLANT
BLADE SAW SGTL 18X1.27X75MM (BLADE) ×1
CLOSURE WOUND 1/2 X4 (GAUZE/BANDAGES/DRESSINGS)
COVER PERINEAL POST (MISCELLANEOUS) ×3 IMPLANT
COVER SURGICAL LIGHT HANDLE (MISCELLANEOUS) ×3 IMPLANT
COVER WAND RF STERILE (DRAPES) ×3 IMPLANT
DRAPE STERI IOBAN 125X83 (DRAPES) ×3 IMPLANT
DRAPE U-SHAPE 47X51 STRL (DRAPES) ×6 IMPLANT
DRSG AQUACEL AG ADV 3.5X10 (GAUZE/BANDAGES/DRESSINGS) ×3 IMPLANT
DURAPREP 26ML APPLICATOR (WOUND CARE) ×3 IMPLANT
ELECT REM PT RETURN 15FT ADLT (MISCELLANEOUS) ×3 IMPLANT
GAUZE XEROFORM 1X8 LF (GAUZE/BANDAGES/DRESSINGS) ×3 IMPLANT
GLOVE BIO SURGEON STRL SZ7.5 (GLOVE) ×3 IMPLANT
GLOVE BIOGEL PI IND STRL 8 (GLOVE) ×2 IMPLANT
GLOVE BIOGEL PI INDICATOR 8 (GLOVE) ×4
GLOVE ECLIPSE 8.0 STRL XLNG CF (GLOVE) ×3 IMPLANT
GOWN STRL REUS W/TWL XL LVL3 (GOWN DISPOSABLE) ×6 IMPLANT
HANDPIECE INTERPULSE COAX TIP (DISPOSABLE) ×3
HEAD M SROM 36MM 2 (Hips) IMPLANT
HOLDER FOLEY CATH W/STRAP (MISCELLANEOUS) ×3 IMPLANT
KIT TURNOVER KIT A (KITS) IMPLANT
LINER ACETAB NEUTRAL 36ID 520D (Liner) ×2 IMPLANT
PACK ANTERIOR HIP CUSTOM (KITS) ×3 IMPLANT
PENCIL SMOKE EVACUATOR (MISCELLANEOUS) IMPLANT
PIN SECTOR W/GRIP ACE CUP 52MM (Hips) ×2 IMPLANT
SET HNDPC FAN SPRY TIP SCT (DISPOSABLE) ×1 IMPLANT
SROM M HEAD 36MM 2 (Hips) ×3 IMPLANT
STAPLER VISISTAT 35W (STAPLE) IMPLANT
STEM CORAIL KA12 (Stem) ×2 IMPLANT
STRIP CLOSURE SKIN 1/2X4 (GAUZE/BANDAGES/DRESSINGS) IMPLANT
SUT ETHIBOND NAB CT1 #1 30IN (SUTURE) ×3 IMPLANT
SUT ETHILON 2 0 PS N (SUTURE) IMPLANT
SUT MNCRL AB 4-0 PS2 18 (SUTURE) IMPLANT
SUT VIC AB 0 CT1 36 (SUTURE) ×3 IMPLANT
SUT VIC AB 1 CT1 36 (SUTURE) ×3 IMPLANT
SUT VIC AB 2-0 CT1 27 (SUTURE) ×6
SUT VIC AB 2-0 CT1 TAPERPNT 27 (SUTURE) ×2 IMPLANT
TRAY FOLEY MTR SLVR 16FR STAT (SET/KITS/TRAYS/PACK) IMPLANT

## 2020-02-26 NOTE — Evaluation (Signed)
Physical Therapy Evaluation Patient Details Name: [name] MRN: [id number] DOB: Apr 11, 1960 Today's Date: 02/26/2020   History of Present Illness  Patient is 59 y.o. female s/p Lt THA anterior approach on 02/26/20 with PMH significant for OA, HTN, GERD, depression, anxiety.    Clinical Impression  Azyria Osmon is a 59 y.o. female POD 0 s/p Lt THA. Patient reports independence with mobility at baseline. Patient is now limited by functional impairments (see PT problem list below) and requires min assist for bed mobility and was educated on use of belt to assist Lt LE on/off EOB. Patient limited to exercises in bed due to c/o dizziness with sitting EOB and hypotension. Patient instructed in exercise to facilitate ROM and circulation. Patient will benefit from continued skilled PT interventions to address impairments and progress towards PLOF. Acute PT will follow to progress mobility and stair training in preparation for safe discharge home.     Follow Up Recommendations Follow surgeon's recommendation for DC plan and follow-up therapies;Home health PT    Equipment Recommendations  Rolling walker with 5" wheels    Recommendations for Other Services       Precautions / Restrictions Precautions Precautions: Fall Restrictions Weight Bearing Restrictions: No Other Position/Activity Restrictions: WBAT      Mobility  Bed Mobility Overal bed mobility: Needs Assistance Bed Mobility: Supine to Sit;Sit to Supine     Supine to sit: Min assist;HOB elevated Sit to supine: Min assist;HOB elevated   General bed mobility comments: Patient required assist for Lt LE to mobilize to EOB and cues to use be rail to raise trunk. Pt c/o dizziness and returned to supine with min assist to raise bil LE's into bed. Pt attempted second time to sit EOB and again c/o diziness. Returned to supine at EOS. Pt educated on use of gait belt for mobilizing LE off EOB.    Transfers                     Ambulation/Gait                Stairs            Wheelchair Mobility    Modified Rankin (Stroke Patients Only)       Balance Overall balance assessment: Needs assistance Sitting-balance support: Feet supported Sitting balance-Leahy Scale: Good                                       Pertinent Vitals/Pain Pain Assessment: 0-10 Pain Score: 4  Pain Location: Lt knee Pain Descriptors / Indicators: Aching;Discomfort;Sore Pain Intervention(s): Limited activity within patient's tolerance;Repositioned;Ice applied;Monitored during session    Home Living Family/patient expects to be discharged to:: Private residence Living Arrangements: Spouse/significant other Available Help at Discharge: Family Type of Home: House Home Access: Stairs to enter Entrance Stairs-Rails: None Secretary/administrator of Steps: 2 Home Layout: Two level;Bed/bath upstairs;1/2 bath on main level Home Equipment: Bedside commode      Prior Function Level of Independence: Independent               Hand Dominance   Dominant Hand: Right    Extremity/Trunk Assessment   Upper Extremity Assessment Upper Extremity Assessment: Overall WFL for tasks assessed    Lower Extremity Assessment Lower Extremity Assessment: Overall WFL for tasks assessed    Cervical / Trunk Assessment Cervical / Trunk Assessment: Normal  Communication   Communication: No  difficulties  Cognition Arousal/Alertness: Awake/alert Behavior During Therapy: WFL for tasks assessed/performed Overall Cognitive Status: Within Functional Limits for tasks assessed                                        General Comments General comments (skin integrity, edema, etc.): BP 100/70 and HR 66 in supine after c/o dizziness sitting EOB.    Exercises Total Joint Exercises Ankle Circles/Pumps: AROM;Both;20 reps Quad Sets: AROM;Left;10 reps;Supine Heel Slides: AROM;Left;10 reps;Supine    Assessment/Plan    PT Assessment Patient needs continued PT services  PT Problem List Decreased range of motion;Decreased strength;Decreased activity tolerance;Decreased balance;Decreased mobility;Decreased knowledge of use of DME;Decreased knowledge of precautions       PT Treatment Interventions DME instruction;Gait training;Functional mobility training;Stair training;Therapeutic activities;Therapeutic exercise;Balance training;Patient/family education    PT Goals (Current goals can be found in the Care Plan section)  Acute Rehab PT Goals 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 PT Goal Formulation: With patient Time For Goal Achievement: 03/04/20 Potential to Achieve Goals: Good    Frequency 7X/week   Barriers to discharge        Co-evaluation               AM-PAC PT "6 Clicks" Mobility  Outcome Measure Help needed turning from your back to your side while in a flat bed without using bedrails?: A Little Help needed moving from lying on your back to sitting on the side of a flat bed without using bedrails?: A Little Help needed moving to and from a bed to a chair (including a wheelchair)?: A Little Help needed standing up from a chair using your arms (e.g., wheelchair or bedside chair)?: A Little Help needed to walk in hospital room?: A Little Help needed climbing 3-5 steps with a railing? : A Lot 6 Click Score: 17    End of Session Equipment Utilized During Treatment: Gait belt Activity Tolerance: Patient tolerated treatment well Patient left: in bed;with call bell/phone within reach;with bed alarm set Nurse Communication: Mobility status PT Visit Diagnosis: Muscle weakness (generalized) (M62.81);Difficulty in walking, not elsewhere classified (R26.2)    Time: 2202-5427 PT Time Calculation (min) (ACUTE ONLY): 24 min   Charges:   PT Evaluation $PT Eval Low Complexity: 1 Low PT Treatments $Therapeutic Activity: 8-22 mins         Wynn Maudlin, DPT Acute Rehabilitation Services  Office [phone] Pager [phone]  02/26/2020 4:05 PM

## 2020-02-26 NOTE — Brief Op Note (Signed)
02/26/2020  11:39 AM  PATIENT:  [name]  58 y.o. female  PRE-OPERATIVE DIAGNOSIS:  Osteoarthritis Left Hip  POST-OPERATIVE DIAGNOSIS:  Osteoarthritis Left Hip  PROCEDURE:  Procedure(s): LEFT TOTAL HIP ARTHROPLASTY ANTERIOR APPROACH (Left)  SURGEON:  Surgeon(s) and Role:    Kathryne Hitch, MD - Primary  PHYSICIAN ASSISTANT:  Rexene Edison, PA-C  ANESTHESIA:   spinal  EBL:  100 mL   COUNTS:  YES  DICTATION: .Other Dictation: Dictation Number [id number]  PLAN OF CARE: Admit for overnight observation  PATIENT DISPOSITION:  PACU - hemodynamically stable.   Delay start of Pharmacological VTE agent (>24hrs) due to surgical blood loss or risk of bleeding: no

## 2020-02-26 NOTE — Anesthesia Preprocedure Evaluation (Addendum)
Anesthesia Evaluation  Patient identified by MRN, date of birth, ID band Patient awake    Reviewed: Allergy & Precautions, NPO status , Patient's Chart, lab work & pertinent test results  History of Anesthesia Complications (+) PONV and history of anesthetic complications  Airway Mallampati: III  TM Distance: >3 FB Neck ROM: Full    Dental no notable dental hx.    Pulmonary neg pulmonary ROS,    Pulmonary exam normal breath sounds clear to auscultation       Cardiovascular hypertension, Pt. on medications Normal cardiovascular exam Rhythm:Regular Rate:Normal  ECG: rate 69   Neuro/Psych PSYCHIATRIC DISORDERS Anxiety Depression negative neurological ROS     GI/Hepatic Neg liver ROS, GERD  Controlled,  Endo/Other  negative endocrine ROS  Renal/GU negative Renal ROS     Musculoskeletal  (+) Arthritis ,   Abdominal (+) + obese,   Peds  Hematology negative hematology ROS (+)   Anesthesia Other Findings Osteoarthritis Left Hip  Reproductive/Obstetrics                            Anesthesia Physical Anesthesia Plan  ASA: II  Anesthesia Plan: Spinal   Post-op Pain Management:    Induction: Intravenous  PONV Risk Score and Plan: 3 and Ondansetron, Dexamethasone, Midazolam, Propofol infusion and Treatment may vary due to age or medical condition  Airway Management Planned: Simple Face Mask  Additional Equipment:   Intra-op Plan:   Post-operative Plan:   Informed Consent: I have reviewed the patients History and Physical, chart, labs and discussed the procedure including the risks, benefits and alternatives for the proposed anesthesia with the patient or authorized representative who has indicated his/her understanding and acceptance.     Dental advisory given  Plan Discussed with: CRNA  Anesthesia Plan Comments:         Anesthesia Quick Evaluation

## 2020-02-26 NOTE — H&P (Signed)
TOTAL HIP ADMISSION H&P  Patient is admitted for left total hip arthroplasty.  Subjective:  Chief Complaint: left hip pain  HPI: [name], 59 y.o. female, has a history of pain and functional disability in the left hip(s) due to arthritis and patient has failed non-surgical conservative treatments for greater than 12 weeks to include NSAID's and/or analgesics, corticosteriod injections and activity modification.  Onset of symptoms was gradual starting 2 years ago with gradually worsening course since that time.The patient noted no past surgery on the left hip(s).  Patient currently rates pain in the left hip at 10 out of 10 with activity. Patient has night pain, worsening of pain with activity and weight bearing, trendelenberg gait, pain that interfers with activities of daily living and pain with passive range of motion. Patient has evidence of subchondral cysts, subchondral sclerosis, periarticular osteophytes and joint space narrowing by imaging studies. This condition presents safety issues increasing the risk of falls.  There is no current active infection.  Patient Active Problem List   Diagnosis Date Noted  . Unilateral primary osteoarthritis, left hip 12/30/2019   Past Medical History:  Diagnosis Date  . Anxiety   . Depression   . Family history of adverse reaction to anesthesia    MOTHER- n/v   . GERD (gastroesophageal reflux disease)   . Hypertension   . Insomnia   . Osteoarthritis   . PONV (postoperative nausea and vomiting)     Past Surgical History:  Procedure Laterality Date  . arthroscopic hip surgery    . BREAST SURGERY     AUGMENTATION   . RIGHT HAND SURGERY     . right hip replacement    . transgender surgery      No current facility-administered medications for this encounter.   Current Outpatient Medications  Medication Sig Dispense Refill Last Dose  . estradiol (ESTRACE) 0.5 MG tablet Take 0.5 mg by mouth daily.     Marland Kitchen ibuprofen (ADVIL) 200 MG tablet  Take 400 mg by mouth every 6 (six) hours as needed for moderate pain.     Marland Kitchen lisinopril (PRINIVIL,ZESTRIL) 10 MG tablet Take 10 mg by mouth daily.     Marland Kitchen tolterodine (DETROL LA) 4 MG 24 hr capsule Take 4 mg by mouth daily.     Marland Kitchen estrogens, conjugated, (PREMARIN) 0.3 MG tablet Take 0.3 mg by mouth daily. Take daily for 21 days then do not take for 7 days. (Patient not taking: Reported on 02/16/2020)   Not Taking at Unknown time  . ondansetron (ZOFRAN) 4 MG tablet Take 1 tablet (4 mg total) by mouth every 8 (eight) hours as needed for nausea or vomiting. (Patient not taking: Reported on 02/16/2020) 20 tablet 0 Not Taking at Unknown time   No Known Allergies  Social History   Tobacco Use  . Smoking status: Never Smoker  . Smokeless tobacco: Never Used  Substance Use Topics  . Alcohol use: Yes    Comment: ONE GLASS EVERY OTHER NITE     Family History  Problem Relation Age of Onset  . Hypertension Father      Review of Systems  Musculoskeletal: Positive for gait problem.  All other systems reviewed and are negative.   Objective:  Physical Exam Vitals reviewed.  Constitutional:      Appearance: Normal appearance.  HENT:     Head: Normocephalic and atraumatic.  Eyes:     Extraocular Movements: Extraocular movements intact.     Pupils: Pupils are equal, round, and reactive  to light.  Cardiovascular:     Rate and Rhythm: Normal rate and regular rhythm.     Pulses: Normal pulses.     Heart sounds: Normal heart sounds.  Pulmonary:     Effort: Pulmonary effort is normal.     Breath sounds: Normal breath sounds.  Abdominal:     Palpations: Abdomen is soft.  Musculoskeletal:     Cervical back: Normal range of motion.     Left hip: Tenderness and bony tenderness present. Decreased range of motion. Decreased strength.  Neurological:     Mental Status: She is alert and oriented to person, place, and time.  Psychiatric:        Behavior: Behavior normal.     Vital signs in last 24  hours:    Labs:   Estimated body mass index is 30.4 kg/m as calculated from the following:   Height as of 02/17/20: 5\' 7"  (1.702 m).   Weight as of 02/17/20: 88.1 kg.   Imaging Review Plain radiographs demonstrate severe degenerative joint disease of the left hip(s). The bone quality appears to be good for age and reported activity level.      Assessment/Plan:  End stage arthritis, left hip(s)  The patient history, physical examination, clinical judgement of the provider and imaging studies are consistent with end stage degenerative joint disease of the left hip(s) and total hip arthroplasty is deemed medically necessary. The treatment options including medical management, injection therapy, arthroscopy and arthroplasty were discussed at length. The risks and benefits of total hip arthroplasty were presented and reviewed. The risks due to aseptic loosening, infection, stiffness, dislocation/subluxation,  thromboembolic complications and other imponderables were discussed.  The patient acknowledged the explanation, agreed to proceed with the plan and consent was signed. Patient is being admitted for inpatient treatment for surgery, pain control, PT, OT, prophylactic antibiotics, VTE prophylaxis, progressive ambulation and ADL's and discharge planning.The patient is planning to be discharged home with home health services

## 2020-02-26 NOTE — Anesthesia Procedure Notes (Signed)
Spinal  Patient location during procedure: OR Start time: 02/26/2020 10:20 AM End time: 02/26/2020 10:25 AM Staffing Performed: anesthesiologist  Anesthesiologist: Leonides Grills, MD Preanesthetic Checklist Completed: patient identified, IV checked, risks and benefits discussed, surgical consent, monitors and equipment checked, pre-op evaluation and timeout performed Spinal Block Patient position: sitting Prep: DuraPrep Patient monitoring: cardiac monitor, continuous pulse ox and blood pressure Approach: midline Location: L4-5 Injection technique: single-shot Needle Needle type: Pencan  Needle gauge: 24 G Needle length: 9 cm Assessment Sensory level: T10 Additional Notes Functioning IV was confirmed and monitors were applied. Sterile prep and drape, including hand hygiene and sterile gloves were used. The patient was positioned and the spine was prepped. The skin was anesthetized with lidocaine.  Free flow of clear CSF was obtained prior to injecting local anesthetic into the CSF.  The spinal needle aspirated freely following injection.  The needle was carefully withdrawn.  The patient tolerated the procedure well.

## 2020-02-26 NOTE — Transfer of Care (Signed)
Immediate Anesthesia Transfer of Care Note  Patient: Navada Osterhout  Procedure(s) Performed: LEFT TOTAL HIP ARTHROPLASTY ANTERIOR APPROACH (Left Hip)  Patient Location: PACU  Anesthesia Type:Spinal  Level of Consciousness: drowsy and patient cooperative  Airway & Oxygen Therapy: Patient Spontanous Breathing and Patient connected to face mask oxygen  Post-op Assessment: Report given to RN and Post -op Vital signs reviewed and stable  Post vital signs: Reviewed and stable  Last Vitals:  Vitals Value Taken Time  BP 93/61 02/26/20 1200  Temp    Pulse 67 02/26/20 1202  Resp 10 02/26/20 1202  SpO2 100 % 02/26/20 1202  Vitals shown include unvalidated device data.  Last Pain:  Vitals:   02/26/20 0813  TempSrc: Oral  PainSc:          Complications: No complications documented.

## 2020-02-27 DIAGNOSIS — M1612 Unilateral primary osteoarthritis, left hip: Secondary | ICD-10-CM | POA: Diagnosis not present

## 2020-02-27 LAB — BASIC METABOLIC PANEL
Anion gap: 9 (ref 5–15)
BUN: 14 mg/dL (ref 6–20)
CO2: 24 mmol/L (ref 22–32)
Calcium: 8.3 mg/dL — ABNORMAL LOW (ref 8.9–10.3)
Chloride: 103 mmol/L (ref 98–111)
Creatinine, Ser: 0.56 mg/dL (ref 0.44–1.00)
GFR, Estimated: >60 mL/min (ref >60)
Glucose, Bld: 159 mg/dL — ABNORMAL HIGH (ref 70–99)
Potassium: 3.8 mmol/L (ref 3.5–5.1)
Sodium: 136 mmol/L (ref 135–145)

## 2020-02-27 LAB — CBC
HCT: 34.8 % — ABNORMAL LOW (ref 36.0–46.0)
Hemoglobin: 11.3 g/dL — ABNORMAL LOW (ref 12.0–15.0)
MCH: 31 pg (ref 26.0–34.0)
MCHC: 32.5 g/dL (ref 30.0–36.0)
MCV: 95.3 fL (ref 80.0–100.0)
Platelets: 197 10*3/uL (ref 150–400)
RBC: 3.65 MIL/uL — ABNORMAL LOW (ref 3.87–5.11)
RDW: 13 % (ref 11.5–15.5)
WBC: 13.2 10*3/uL — ABNORMAL HIGH (ref 4.0–10.5)
nRBC: 0 % (ref 0.0–0.2)

## 2020-02-27 MED ORDER — OXYCODONE HCL 5 MG PO TABS
5.0000 mg | ORAL_TABLET | ORAL | 0 refills | Status: AC | PRN
Start: 2020-02-27 — End: ?

## 2020-02-27 MED ORDER — METHOCARBAMOL 500 MG PO TABS: 500.0000 mg | ORAL_TABLET | Freq: Four times a day (QID) | ORAL | 1 refills | Status: AC | PRN

## 2020-02-27 MED ORDER — ASPIRIN 81 MG PO CHEW: 81.0000 mg | CHEWABLE_TABLET | Freq: Two times a day (BID) | ORAL | 0 refills | Status: AC

## 2020-02-27 NOTE — Plan of Care (Signed)
Plan of care reviewed with patient. Neurovascular intact. Pain management continuing. Foley to be d/c after physical therapy

## 2020-02-27 NOTE — TOC Progression Note (Signed)
Transition of Care [facility]) - Progression Note    Patient Details  Name: [name] MRN: [id number] Date of Birth: [birth year]  [facility] CM/SW Contact  Armanda Heritage, RN Phone Number: 02/27/2020, 1:10 PM  Clinical Narrative:    [employer] to deliver rolling walker to bedside for home use, declines 3in1.  [facility] in [place] given referral for HHPT.   Expected Discharge Plan: Home w Home Health Services Barriers to Discharge: No Barriers Identified  Expected Discharge Plan and Services Expected Discharge Plan: Home w Home Health Services   Discharge Planning Services: CM Consult Post Acute Care Choice: Home Health Living arrangements for the past 2 months: Single Family Home Expected Discharge Date: 02/27/20               DME Arranged: Dan Humphreys rolling DME Agency: [employer] Date DME Agency Contacted: 02/27/20 Time DME Agency Contacted: [phone] Representative spoke with at DME Agency: Kathline Magic HH Arranged: PT HH Agency: [facility] [place], [place]) Date [facility] Agency Contacted: 02/27/20 Time HH Agency Contacted: [phone] Representative spoke with at [facility] Agency: on-call rep   Social Determinants of Health (SDOH) Interventions    Readmission Risk Interventions No flowsheet data found.

## 2020-02-27 NOTE — Progress Notes (Signed)
Physical Therapy Treatment Patient Details Name: [name] MRN: [id number] DOB: 1960/11/19 Today's Date: 02/27/2020    History of Present Illness Patient is 59 y.o. female s/p Lt THA anterior approach on 02/26/20 with PMH significant for OA, HTN, GERD, depression, anxiety.    PT Comments    Pt ambulated excellent hallway distance with improving form as L hip stiffness resolved with further ambulation. Pt requiring some cues for form and safety, but demonstrates excellent carryover and understanding of cues. Pt proficiently navigated steps and completed THA exercises. PT educated pt on the importance of frequent mobility at home, recommending up and walking 1x/hour for 5-10 minute bouts with supervision of wife. All education completed, pt with no further questions.    Follow Up Recommendations  Follow surgeon's recommendation for DC plan and follow-up therapies;Home health PT     Equipment Recommendations  Rolling walker with 5" wheels    Recommendations for Other Services       Precautions / Restrictions Precautions Precautions: Fall Restrictions Weight Bearing Restrictions: No Other Position/Activity Restrictions: WBAT    Mobility  Bed Mobility Overal bed mobility: Needs Assistance Bed Mobility: Supine to Sit;Sit to Supine     Supine to sit: Min assist Sit to supine: Min assist   General bed mobility comments: min assist for lifting LLE and translating in/out of bed, wife able to assist with this at home.  Transfers Overall transfer level: Needs assistance Equipment used: Rolling walker (2 wheeled) Transfers: Sit to/from Stand Sit to Stand: Supervision         General transfer comment: for safety, verbal cuing for power up with at least one hand on bed.  Ambulation/Gait Ambulation/Gait assistance: Supervision Gait Distance (Feet): 250 Feet Assistive device: Rolling walker (2 wheeled) Gait Pattern/deviations: Decreased stride length;Step-to  pattern;Step-through pattern;Trunk flexed Gait velocity: decr   General Gait Details: supervision for safety, verbal cuing for upright posture, placement within RW, pushing as opposed to lifting RW.   Stairs Stairs: Yes Stairs assistance: Min guard Stair Management: No rails;Forwards;With cane;Step to pattern Number of Stairs: 3 (x2) General stair comments: min guard for safety, HHA in LUE and cane in RUE. Verbal cuing for sequencing (up with good, down with bad leg leading), placement of caregiver below pt on steps.   Wheelchair Mobility    Modified Rankin (Stroke Patients Only)       Balance Overall balance assessment: Needs assistance Sitting-balance support: Feet supported Sitting balance-Leahy Scale: Good     Standing balance support: Bilateral upper extremity supported Standing balance-Leahy Scale: Poor Standing balance comment: reliant on external support                            Cognition Arousal/Alertness: Awake/alert Behavior During Therapy: WFL for tasks assessed/performed Overall Cognitive Status: Within Functional Limits for tasks assessed                                        Exercises Total Joint Exercises Ankle Circles/Pumps: AROM;Both;10 reps;Supine Quad Sets: AROM;Left;Supine;5 reps Heel Slides: AROM;Left;Supine;5 reps (with gait belt loop to aid in form and initiation of movement) Long Arc Quad: AAROM;Left;5 reps;Seated    General Comments        Pertinent Vitals/Pain Pain Assessment: 0-10 Pain Score: 4  Pain Location: L hip Pain Descriptors / Indicators: Aching;Discomfort;Sore Pain Intervention(s): Limited activity within patient's tolerance;Monitored during session;Repositioned  Home Living                      Prior Function            PT Goals (current goals can now be found in the care plan section) Acute Rehab PT Goals Patient Stated Goal: 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 PT Goal Formulation: With patient Time For Goal Achievement: 03/04/20 Potential to Achieve Goals: Good Progress towards PT goals: Progressing toward goals    Frequency    7X/week      PT Plan Current plan remains appropriate    Co-evaluation              AM-PAC PT "6 Clicks" Mobility   Outcome Measure  Help needed turning from your back to your side while in a flat bed without using bedrails?: A Little Help needed moving from lying on your back to sitting on the side of a flat bed without using bedrails?: A Little Help needed moving to and from a bed to a chair (including a wheelchair)?: A Little Help needed standing up from a chair using your arms (e.g., wheelchair or bedside chair)?: A Little Help needed to walk in hospital room?: A Little Help needed climbing 3-5 steps with a railing? : A Little 6 Click Score: 18    End of Session Equipment Utilized During Treatment: Gait belt Activity Tolerance: Patient tolerated treatment well Patient left: in bed;with call bell/phone within reach;with bed alarm set Nurse Communication: Mobility status PT Visit Diagnosis: Muscle weakness (generalized) (M62.81);Difficulty in walking, not elsewhere classified (R26.2)     Time: 5638-7564 PT Time Calculation (min) (ACUTE ONLY): 42 min  Charges:  $Gait Training: 23-37 mins $Therapeutic Exercise: 8-22 mins                     Abbygayle Helfand E, PT Acute Rehabilitation Services Pager [phone]  Office [phone]     Taneika Choi D Despina Hidden 02/27/2020, 11:55 AM

## 2020-02-27 NOTE — Plan of Care (Signed)
Patient discharged all care plans met. [name]

## 2020-02-27 NOTE — Discharge Summary (Signed)
Patient ID: [name] MRN: [id number] DOB/AGE: [birth year] 59 y.o.  Admit date: 02/26/2020 Discharge date: 02/27/2020  Admission Diagnoses:  Principal Problem:   Unilateral primary osteoarthritis, left hip Active Problems:   Status post total replacement of left hip   Discharge Diagnoses:  Same  Past Medical History:  Diagnosis Date  . Anxiety   . Depression   . Family history of adverse reaction to anesthesia    MOTHER- n/v   . GERD (gastroesophageal reflux disease)   . Hypertension   . Insomnia   . Osteoarthritis   . PONV (postoperative nausea and vomiting)     Surgeries: Procedure(s): LEFT TOTAL HIP ARTHROPLASTY ANTERIOR APPROACH on 02/26/2020   Consultants:   Discharged Condition: Improved  Hospital Course: [name] is an 59 y.o. female who was admitted 02/26/2020 for operative treatment ofUnilateral primary osteoarthritis, left hip. Patient has severe unremitting pain that affects sleep, daily activities, and work/hobbies. After pre-op clearance the patient was taken to the operating room on 02/26/2020 and underwent  Procedure(s): LEFT TOTAL HIP ARTHROPLASTY ANTERIOR APPROACH.    Patient was given perioperative antibiotics:  Anti-infectives (From admission, onward)   Start     Dose/Rate Route Frequency Ordered Stop   02/26/20 1630  ceFAZolin (ANCEF) IVPB 1 g/50 mL premix        1 g 100 mL/hr over 30 Minutes Intravenous Every 6 hours 02/26/20 1154 02/26/20 2222   02/26/20 0730  ceFAZolin (ANCEF) IVPB 2g/100 mL premix        2 g 200 mL/hr over 30 Minutes Intravenous On call to O.R. 02/26/20 0727 02/26/20 1035       Patient was given sequential compression devices, early ambulation, and chemoprophylaxis to prevent DVT.  Patient benefited maximally from hospital stay and there were no complications.    Recent vital signs:  Patient Vitals for the past 24 hrs:  BP Temp Temp src Pulse Resp SpO2 Height  02/27/20 0852 123/83 98.5 F (36.9 C) Oral 86 18  99 % --  02/27/20 0602 122/77 98.4 F (36.9 C) Oral 90 16 97 % --  02/27/20 0153 120/73 98.6 F (37 C) Oral 85 16 97 % --  02/26/20 2056 129/79 98 F (36.7 C) Oral 86 15 96 % --  02/26/20 1856 124/83 98.4 F (36.9 C) Oral 83 16 98 % --  02/26/20 1633 128/78 97.6 F (36.4 C) Oral 66 18 97 % --  02/26/20 1504 (!) 130/91 (!) 97.4 F (36.3 C) Oral 66 14 100 % --  02/26/20 1428 -- -- -- -- -- -- 5\' 7"  (1.702 m)  02/26/20 1405 123/82 97.6 F (36.4 C) Oral 65 16 100 % --  02/26/20 1330 130/87 -- -- 60 20 100 % --  02/26/20 1315 119/79 -- -- (!) 59 (!) 26 100 % --  02/26/20 1300 124/85 (!) 97.4 F (36.3 C) -- (!) 58 (!) 22 99 % --  02/26/20 1245 114/78 -- -- (!) 59 19 100 % --  02/26/20 1230 106/77 -- -- 64 15 100 % --  02/26/20 1215 107/79 -- -- (!) 58 18 100 % --  02/26/20 1158 93/61 (!) 97.3 F (36.3 C) -- 71 13 100 % --     Recent laboratory studies:  Recent Labs    02/27/20 0334  WBC 13.2*  HGB 11.3*  HCT 34.8*  PLT 197  NA 136  K 3.8  CL 103  CO2 24  BUN 14  CREATININE 0.56  GLUCOSE 159*  CALCIUM 8.3*  Discharge Medications:   Allergies as of 02/27/2020   No Known Allergies     Medication List    TAKE these medications   aspirin 81 MG chewable tablet Chew 1 tablet (81 mg total) by mouth 2 (two) times daily.   estradiol 0.5 MG tablet Commonly known as: ESTRACE Take 0.5 mg by mouth daily.   ibuprofen 200 MG tablet Commonly known as: ADVIL Take 400 mg by mouth every 6 (six) hours as needed for moderate pain.   lisinopril 10 MG tablet Commonly known as: ZESTRIL Take 10 mg by mouth daily.   methocarbamol 500 MG tablet Commonly known as: ROBAXIN Take 1 tablet (500 mg total) by mouth every 6 (six) hours as needed for muscle spasms.   oxyCODONE 5 MG immediate release tablet Commonly known as: Oxy IR/ROXICODONE Take 1-2 tablets (5-10 mg total) by mouth every 4 (four) hours as needed for moderate pain (pain score 4-6).   tolterodine 4 MG 24 hr  capsule Commonly known as: DETROL LA Take 4 mg by mouth daily.            Durable Medical Equipment  (From admission, onward)         Start     Ordered   02/26/20 1402  DME 3 n 1  Once        02/26/20 1401   02/26/20 1402  DME Walker rolling  Once       Question Answer Comment  Walker: With 5 Inch Wheels   Patient needs a walker to treat with the following condition Status post total replacement of left hip      02/26/20 1401          Diagnostic Studies: DG Pelvis Portable  Result Date: 02/26/2020 CLINICAL DATA:  Postop left hip replacement EXAM: PORTABLE PELVIS 1-2 VIEWS COMPARISON:  12/30/2019 FINDINGS: Left hip replacement in satisfactory position alignment. No fracture or complication Chronic right hip replacement unchanged. IMPRESSION: Satisfactory postop left hip replacement. Electronically Signed   By: Marlan Palau M.D.   On: 02/26/2020 14:39   DG C-Arm 1-60 Min-No Report  Result Date: 02/26/2020 CLINICAL DATA:  Left hip replacement EXAM: OPERATIVE left HIP (WITH PELVIS IF PERFORMED) 3 VIEWS TECHNIQUE: Fluoroscopic spot image(s) were submitted for interpretation post-operatively. COMPARISON:  12/30/2019 FINDINGS: Left hip replacement in satisfactory position alignment. No fracture or complication. IMPRESSION: Satisfactory left hip replacement. Electronically Signed   By: Marlan Palau M.D.   On: 02/26/2020 14:34   DG HIP OPERATIVE UNILAT W OR W/O PELVIS LEFT  Result Date: 02/26/2020 CLINICAL DATA:  Left hip replacement EXAM: OPERATIVE left HIP (WITH PELVIS IF PERFORMED) 3 VIEWS TECHNIQUE: Fluoroscopic spot image(s) were submitted for interpretation post-operatively. COMPARISON:  12/30/2019 FINDINGS: Left hip replacement in satisfactory position alignment. No fracture or complication. IMPRESSION: Satisfactory left hip replacement. Electronically Signed   By: Marlan Palau M.D.   On: 02/26/2020 14:34    Disposition: Discharge disposition: 01-Home or Self  Care          Follow-up Information    Kathryne Hitch, MD Follow up in 2 week(s).   Specialty: Orthopedic Surgery Contact information: [address] [place] [address] [phone]                Signed: Kathryne Hitch 02/27/2020, 10:54 AM

## 2020-02-27 NOTE — Plan of Care (Signed)
  Problem: Clinical Measurements: Goal: Ability to maintain clinical measurements within normal limits will improve Outcome: Progressing   Problem: Nutrition: Goal: Adequate nutrition will be maintained Outcome: Progressing   Problem: Elimination: Goal: Will not experience complications related to bowel motility Outcome: Progressing   

## 2020-02-27 NOTE — Progress Notes (Signed)
Subjective: 1 Day Post-Op Procedure(s) (LRB): LEFT TOTAL HIP ARTHROPLASTY ANTERIOR APPROACH (Left) Patient reports pain as moderate.  Has already benn cleared by therapy.  Objective: Vital signs in last 24 hours: Temp:  [97.3 F (36.3 C)-98.6 F (37 C)] 98.5 F (36.9 C) (11/20 0852) Pulse Rate:  [58-90] 86 (11/20 0852) Resp:  [13-26] 18 (11/20 0852) BP: (93-130)/(61-91) 123/83 (11/20 0852) SpO2:  [96 %-100 %] 99 % (11/20 0852)  Intake/Output from previous day: 11/19 0701 - 11/20 0700 In: 3343.4 [P.O.:960; I.V.:2333.4; IV Piggyback:50] Out: 1700 [Urine:1600; Blood:100] Intake/Output this shift: No intake/output data recorded.  Recent Labs    02/27/20 0334  HGB 11.3*   Recent Labs    02/27/20 0334  WBC 13.2*  RBC 3.65*  HCT 34.8*  PLT 197   Recent Labs    02/27/20 0334  NA 136  K 3.8  CL 103  CO2 24  BUN 14  CREATININE 0.56  GLUCOSE 159*  CALCIUM 8.3*   No results for input(s): LABPT, INR in the last 72 hours.  Sensation intact distally Intact pulses distally Dorsiflexion/Plantar flexion intact Incision: scant drainage No cellulitis present Compartment soft   Assessment/Plan: 1 Day Post-Op Procedure(s) (LRB): LEFT TOTAL HIP ARTHROPLASTY ANTERIOR APPROACH (Left) Up with therapy Discharge home with home health today.    Patient's anticipated LOS is less than 2 midnights, meeting these requirements: - Younger than 36 - Lives within 1 hour of care - Has a competent adult at home to recover with post-op recover - NO history of  - Chronic pain requiring opiods  - Diabetes  - Coronary Artery Disease  - Heart failure  - Heart attack  - Stroke  - DVT/VTE  - Cardiac arrhythmia  - Respiratory Failure/COPD  - Renal failure  - Anemia  - Advanced Liver disease       Kathryne Hitch 02/27/2020, 10:51 AM

## 2020-02-27 NOTE — Op Note (Signed)
[name], [name]:[id number] ACCOUNT [id number] DATE OF BIRTH:1960/07/24 FACILITY: [facility] LOCATION: [facility] PHYSICIAN:Ellene Bloodsaw Aretha Parrot, MD  OPERATIVE REPORT  DATE OF PROCEDURE:  02/26/2020  PREOPERATIVE DIAGNOSIS:  Osteoarthritis and degenerative joint disease, left hip.  POSTOPERATIVE DIAGNOSIS:  Osteoarthritis and degenerative joint disease, left hip again.  PROCEDURE:  Left total hip arthroplasty through direct anterior approach.  IMPLANTS:  DePuy Sector Gription acetabular component size 52, size 36+0 neutral polyethylene liner, size 12 Corail femoral component with standard offset, size 36 -2 metal hip ball.  SURGEON:  Vanita Panda. [name], MD  ASSISTANT:  Richardean Canal, PA-C  ANESTHESIA:  Spinal.  ANTIBIOTICS:  Two grams IV Ancef.  BLOOD LOSS:  200 mL.  COMPLICATIONS:  None.  INDICATIONS:  The patient is a 59 year old patient who has debilitating arthritis involving the left hip.  The patient has actually had a right total hip arthroplasty done between 6 and 7 years ago at [facility].  This was actually done through a direct  anterior approach as well.  That hip actually subsided.  Her leg lengths are close to equal, but the patient does understand that we need to go with a stable hip more than anything and I cannot shorten this side to match the other side.  We described in  detail what the surgery involves, including a thorough discussion of the risk of acute blood loss anemia, nerve or vessel injury, fracture, DVT, dislocation, implant failure and skin and soft tissue issues.  We talked about the goals being decreased  pain, improved mobility and overall improved quality of life.  DESCRIPTION OF PROCEDURE:  After informed consent was obtained, appropriate left hip was marked.  The patient was brought to the operating room and sat up on a stretcher where spinal anesthesia was obtained, then was laid in supine position.  Foley  catheter was placed  and both feet had traction boots applied to them.  Next, she was placed supine on the Hana fracture table, the perineal post in place and both legs in line skeletal traction device and no traction applied.  The left operative hip was  prepped and draped with DuraPrep and sterile drapes.  A time-out was called.  The patient was identified as correct patient, correct left hip.  We then made an incision just inferior and posterior to the anterior defect spine and carried this obliquely  down the leg.  Dissected down tensor fascia lata muscle.  Tensor fascia was then divided longitudinally to proceed with direct anterior approach to the hip.  We identified and cauterized circumflex vessels and identified the hip capsule, opened the hip  capsule in an L-type format, opened up the joint capsule with a moderate joint effusion.  I placed Cobra retractors around the medial and lateral femoral neck and made our femoral neck cut with an oscillating saw just proximal to the lesser trochanter  and completed this with osteotome.  We placed a corkscrew guide in the femoral head and removed the femoral head in its entirety and found a wide area devoid of cartilage.  There were periarticular osteophytes around the acetabular rim that I removed.  I  then removed remnants of acetabular labrum and other debris.  I placed a bent Hohmann over the medial acetabular rim and then began reaming under direct visualization from a size 44 reamer in stepwise increments going up to a size 51 with all reamers  placed under direct visualization, the last reamer was placed under direct fluoroscopy so I could obtain  my depth of reaming my inclination and anteversion.  I then placed the real DePuy Sector Gription acetabular component size 52 and a 36+0 neutral  polyethylene liner for that size acetabular component.  Attention was then turned to the femur.  With the leg externally rotated to 120 degrees, extended and adducted, we replaced  Mueller retractor medially and Hohman retractor behind the greater  trochanter.  We released lateral joint capsule and used a box-cutting osteotome to enter the femoral canal and a rongeur to lateralize, then began broaching using the Corail broaching system from a size 8 going up to a size 12.  With a size 12 in place,  we trialed a standard offset femoral neck and a 36 -2 trial hip ball, and reduced this in the acetabulum and we were pleased with leg length, offset, range of motion and stability assessed radiographically and mechanically.  We then dislocated the hip  and removed the trial components.  I placed the real Corail femoral component size 12 with standard offset and the real 36 -2 metal hip ball and again reduced this in the acetabulum and it was stable.  We then irrigated the soft tissue with normal saline  solution using pulsatile lavage.  We closed the joint capsule with interrupted #1 Ethibond suture, followed by closing the tensor fascia with #1 Vicryl.  0 Vicryl was used to close the deep tissue and 2-0 Vicryl was used to close the subcutaneous  tissue.  The skin was reapproximated with staples.  Xeroform well-padded sterile dressing was applied.  The patient was taken off the Hana table and taken to recovery room in stable condition with all final counts being correct.  There were no  complications noted.  Note Rexene Edison, PA-C, did assist during the entire case and assistance was crucial for facilitating all aspects of this case.  IN/NUANCE  D:02/26/2020 T:02/27/2020 JOB:[id number]

## 2020-02-27 NOTE — Discharge Instructions (Signed)

## 2020-02-28 NOTE — Anesthesia Postprocedure Evaluation (Signed)
Anesthesia Post Note  Patient: [name]  Procedure(s) Performed: LEFT TOTAL HIP ARTHROPLASTY ANTERIOR APPROACH (Left Hip)     Patient location during evaluation: PACU Anesthesia Type: Spinal Level of consciousness: oriented and awake and alert Pain management: pain level controlled Vital Signs Assessment: post-procedure vital signs reviewed and stable Respiratory status: spontaneous breathing, respiratory function stable and patient connected to nasal cannula oxygen Cardiovascular status: blood pressure returned to baseline and stable Postop Assessment: no headache, no backache, no apparent nausea or vomiting and spinal receding Anesthetic complications: no   No complications documented.  Last Vitals:  Vitals:   02/27/20 0602 02/27/20 0852  BP: 122/77 123/83  Pulse: 90 86  Resp: 16 18  Temp: 36.9 C 36.9 C  SpO2: 97% 99%    Last Pain:  Vitals:   02/27/20 1412  TempSrc:   PainSc: 0-No pain                 [name]

## 2020-02-29 ENCOUNTER — Encounter (HOSPITAL_COMMUNITY): Payer: Self-pay | Admitting: Orthopaedic Surgery

## 2020-02-29 ENCOUNTER — Telehealth: Payer: Self-pay

## 2020-02-29 NOTE — Telephone Encounter (Signed)
Do you know anything about HHPT coming to her? This was one that [facility] could not do, she was sent home without compression stockings and I am trying to figure this out for her?

## 2020-03-10 ENCOUNTER — Ambulatory Visit (INDEPENDENT_AMBULATORY_CARE_PROVIDER_SITE_OTHER): Payer: 59 | Admitting: Physician Assistant

## 2020-03-10 ENCOUNTER — Encounter: Payer: Self-pay | Admitting: Physician Assistant

## 2020-03-10 DIAGNOSIS — Z96642 Presence of left artificial hip joint: Secondary | ICD-10-CM

## 2020-03-10 NOTE — Progress Notes (Signed)
HPI: [name] returns today status post left total hip arthroplasty 02/26/2020.  She states she is overall doing well.  She is using crutches to ambulate but states that time she does forget to use the crutches.  She has had no fevers chills shortness of breath or chest pain.  She is on 81 mg aspirin twice daily she was on no aspirin prior to surgery.  Physical exam: Left hip surgical incisions well approximated with subcu stitch.  There is no signs of infection.  No signs of seroma.  She has good range of motion of the left hip without pain.  Calf supple nontender.  Dorsiflexion plantarflexion left ankle intact.  Impression: Status post left total hip arthroplasty 02/26/2020  Plan: She will remain on aspirin once daily for another week and then discontinue as she is on no aspirin prior to surgery.  Should follow-up with [place] in 1 month sooner if there is any questions concerns.  New Steri-Strips were applied to the left hip today.

## 2020-04-06 ENCOUNTER — Encounter: Payer: Self-pay | Admitting: Orthopaedic Surgery

## 2020-04-06 ENCOUNTER — Ambulatory Visit (INDEPENDENT_AMBULATORY_CARE_PROVIDER_SITE_OTHER): Payer: 59 | Admitting: Orthopaedic Surgery

## 2020-04-06 DIAGNOSIS — Z96642 Presence of left artificial hip joint: Secondary | ICD-10-CM

## 2020-04-06 NOTE — Progress Notes (Signed)
The patient is now 6-week status post a left total hip arthroplasty.  She says she is doing great and has no complaints.  She does have a little bit of start up pain when she first gets up but overall is doing well.  She has remote history of a right total hip done elsewhere.  Her left operative hip is smoothly and fluidly.  Her leg lengths are equal.  This point I would like to see her back in 6 months for repeat x-ray of her pelvis and hip.  However, she is going through an insurance change and I will be out of network.  She can always see a local orthopedic physician in [place] if needed and can get x-rays and send me a copy.  What ever she would like to do and is easiest for her to be fine for me.  All question concerns were answered and addressed.

## 2020-10-05 ENCOUNTER — Ambulatory Visit: Payer: 59 | Admitting: Orthopaedic Surgery

## 2021-05-05 IMAGING — DX DG PORTABLE PELVIS
2 series · 2 of 2 positions shown · non-contrast
Comparison: 12/30/2019

CLINICAL DATA: Postop left hip replacement

EXAM:
PORTABLE PELVIS 1-2 VIEWS

[pelvis ap (1 of 2)]
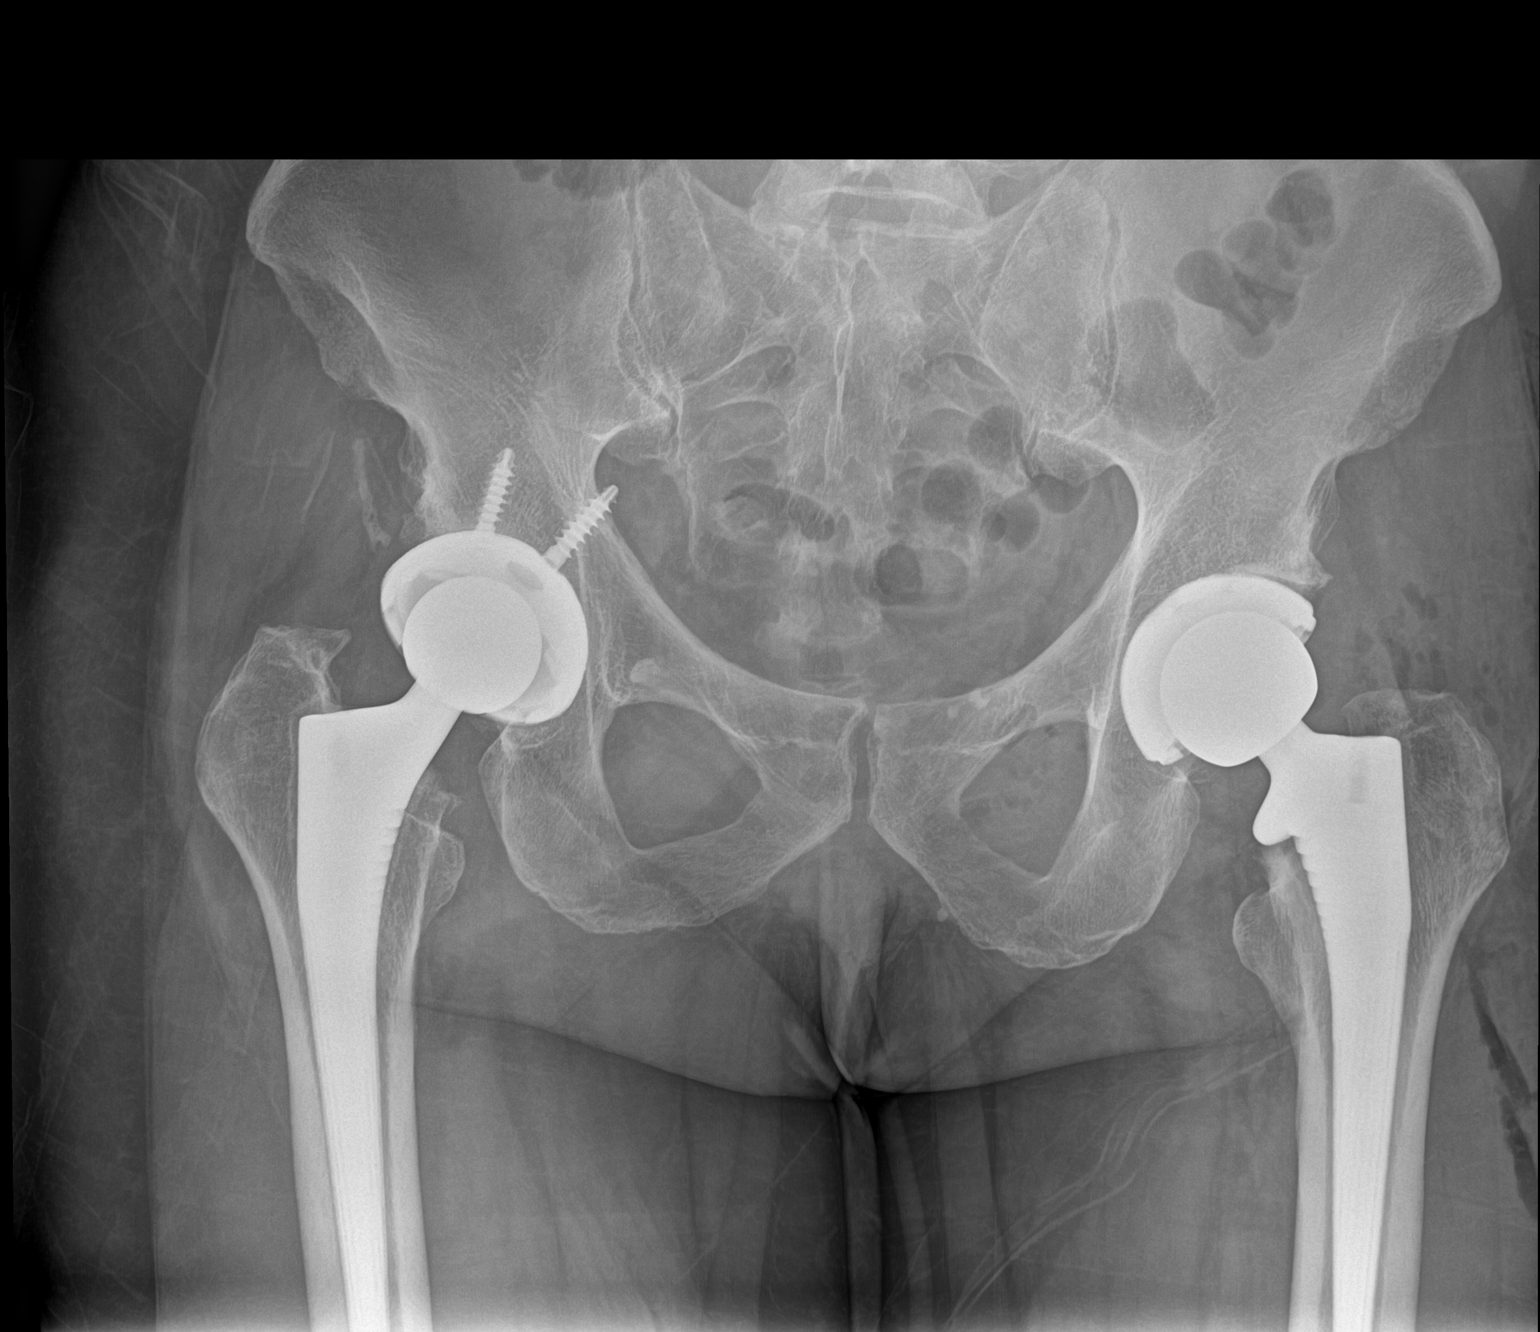

[pelvis ap (2 of 2)]
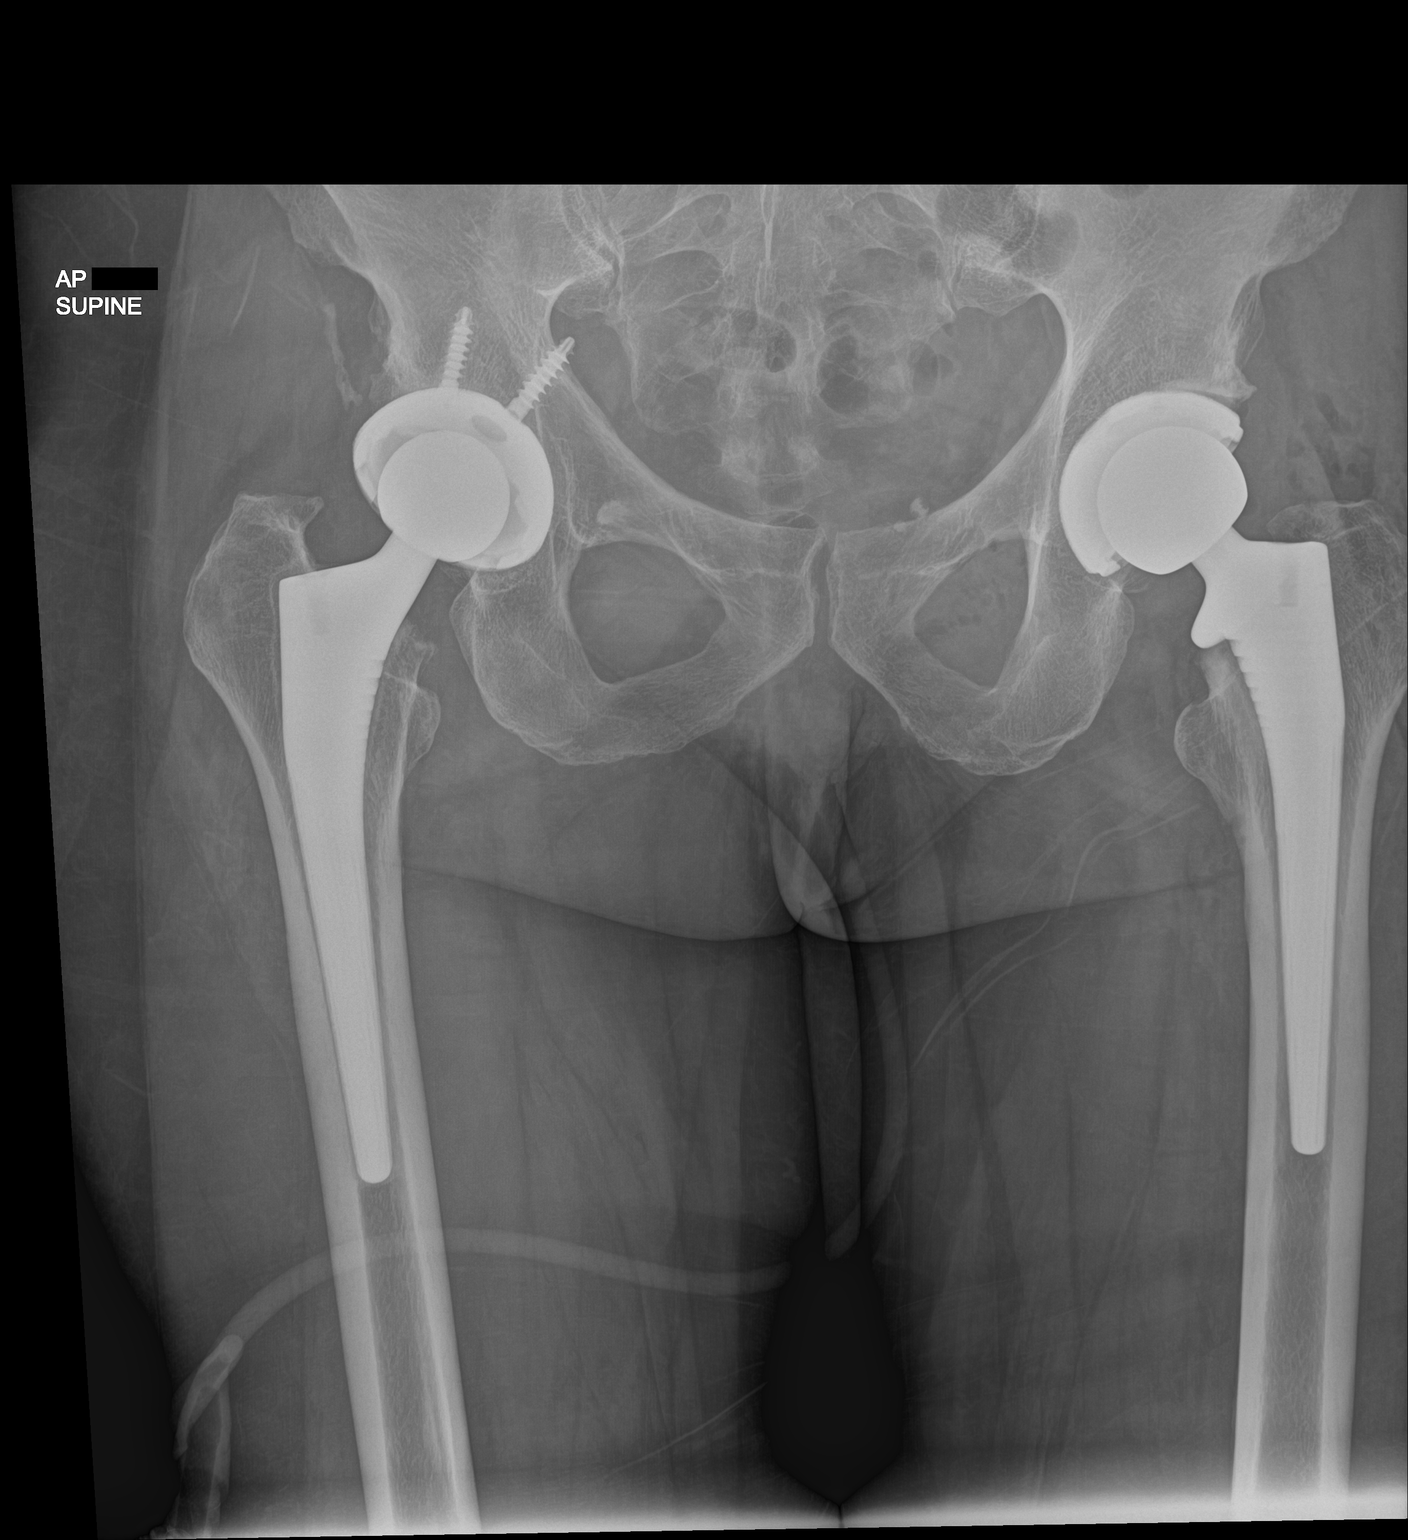

[2 of 2 positions shown; findings below may reference images not displayed]

FINDINGS: Left hip replacement in satisfactory position alignment. No fracture
or complication

Chronic right hip replacement unchanged.
IMPRESSION: Satisfactory postop left hip replacement.

## 2021-07-19 DIAGNOSIS — I10 Essential (primary) hypertension: Secondary | ICD-10-CM | POA: Diagnosis not present

## 2021-07-19 DIAGNOSIS — K219 Gastro-esophageal reflux disease without esophagitis: Secondary | ICD-10-CM | POA: Diagnosis not present

## 2021-07-19 DIAGNOSIS — E785 Hyperlipidemia, unspecified: Secondary | ICD-10-CM | POA: Diagnosis not present

## 2021-07-24 DIAGNOSIS — Z6828 Body mass index (BMI) 28.0-28.9, adult: Secondary | ICD-10-CM | POA: Diagnosis not present

## 2021-07-24 DIAGNOSIS — Z01419 Encounter for gynecological examination (general) (routine) without abnormal findings: Secondary | ICD-10-CM | POA: Diagnosis not present
